# Patient Record
Sex: Female | Born: 1970 | Race: Black or African American | Hispanic: No | Marital: Single | Smoking: Never smoker
Health system: Southern US, Community
[De-identification: ages and names within clinical notes are randomized; demographics above are authoritative.]

## PROBLEM LIST (undated history)

## (undated) ENCOUNTER — Inpatient Hospital Stay (HOSPITAL_COMMUNITY): Payer: Self-pay

## (undated) DIAGNOSIS — N83209 Unspecified ovarian cyst, unspecified side: Secondary | ICD-10-CM

## (undated) HISTORY — PX: OVARIAN CYST REMOVAL: SHX89

## (undated) HISTORY — PX: DILATION AND CURETTAGE OF UTERUS: SHX78

## (undated) HISTORY — DX: Unspecified ovarian cyst, unspecified side: N83.209

---

## 1998-08-04 ENCOUNTER — Other Ambulatory Visit: Admission: RE | Admit: 1998-08-04 | Discharge: 1998-08-04 | Payer: Self-pay | Admitting: *Deleted

## 2000-03-22 ENCOUNTER — Inpatient Hospital Stay (HOSPITAL_COMMUNITY): Admission: AD | Admit: 2000-03-22 | Discharge: 2000-03-22 | Payer: Self-pay | Admitting: *Deleted

## 2000-03-22 ENCOUNTER — Emergency Department (HOSPITAL_COMMUNITY): Admission: EM | Admit: 2000-03-22 | Discharge: 2000-03-22 | Payer: Self-pay | Admitting: *Deleted

## 2000-03-25 ENCOUNTER — Encounter (INDEPENDENT_AMBULATORY_CARE_PROVIDER_SITE_OTHER): Payer: Self-pay

## 2000-03-25 ENCOUNTER — Ambulatory Visit (HOSPITAL_COMMUNITY): Admission: RE | Admit: 2000-03-25 | Discharge: 2000-03-25 | Payer: Self-pay | Admitting: *Deleted

## 2000-04-11 ENCOUNTER — Other Ambulatory Visit: Admission: RE | Admit: 2000-04-11 | Discharge: 2000-04-11 | Payer: Self-pay | Admitting: *Deleted

## 2001-05-24 ENCOUNTER — Other Ambulatory Visit: Admission: RE | Admit: 2001-05-24 | Discharge: 2001-05-24 | Payer: Self-pay | Admitting: *Deleted

## 2002-07-04 ENCOUNTER — Other Ambulatory Visit: Admission: RE | Admit: 2002-07-04 | Discharge: 2002-07-04 | Payer: Self-pay | Admitting: Obstetrics and Gynecology

## 2003-12-10 ENCOUNTER — Other Ambulatory Visit: Admission: RE | Admit: 2003-12-10 | Discharge: 2003-12-10 | Payer: Self-pay | Admitting: Obstetrics and Gynecology

## 2010-07-22 ENCOUNTER — Emergency Department (HOSPITAL_COMMUNITY): Admission: EM | Admit: 2010-07-22 | Discharge: 2010-07-22 | Payer: Self-pay | Admitting: Emergency Medicine

## 2010-07-23 ENCOUNTER — Encounter (INDEPENDENT_AMBULATORY_CARE_PROVIDER_SITE_OTHER): Payer: Self-pay | Admitting: *Deleted

## 2011-01-07 NOTE — Letter (Signed)
Summary: New Patient letter  [facility]  [address] [place], [place] [address]   Phone: [phone]  Fax: [phone]       07/23/2010 MRN: [id number]  [name] [name] [address] [place], [place]  [address]  Dear Ms. [name] [name],  Welcome to the Gastroenterology Division at [facility].    You are scheduled to see Dr. Stan Head on August 17, 2010 at 10:00am on the 3rd floor at [facility], [address]  We ask that you try to arrive at our office 15 minutes prior to your appointment time to allow for check-in.  We would like you to complete the enclosed self-administered evaluation form prior to your visit and bring it with you on the day of your appointment.  We will review it with you.  Also, please bring a complete list of all your medications or, if you prefer, bring the medication bottles and we will list them.  Please bring your insurance card so that we may make a copy of it.  If your insurance requires a referral to see a specialist, please bring your referral form from your primary care physician.  Co-payments are due at the time of your visit and may be paid by cash, check or credit card.     Your office visit will consist of a consult with your physician (includes a physical exam), any laboratory testing he/she may order, scheduling of any necessary diagnostic testing (e.g. x-ray, ultrasound, CT-scan), and scheduling of a procedure (e.g. Endoscopy, Colonoscopy) if required.  Please allow enough time on your schedule to allow for any/all of these possibilities.    If you cannot keep your appointment, please call [phone] to cancel or reschedule prior to your appointment date.  This allows [place] the opportunity to schedule an appointment for another patient in need of care.  If you do not cancel or reschedule by 5 p.m. the business day prior to your appointment date, you will be charged a $50.00 late cancellation/no-show fee.    Thank you for  choosing [facility] for your medical needs.  We appreciate the opportunity to care for you.  Please visit [place] at our website  to learn more about our practice.                     Sincerely,                                                             The Gastroenterology Division

## 2011-02-19 LAB — BASIC METABOLIC PANEL
BUN: 9 mg/dL (ref 6–23)
CO2: 30 mEq/L (ref 19–32)
Calcium: 9.5 mg/dL (ref 8.4–10.5)
Chloride: 105 mEq/L (ref 96–112)
Creatinine, Ser: 0.87 mg/dL (ref 0.4–1.2)
GFR calc Af Amer: >60 mL/min (ref >60)
GFR calc non Af Amer: >60 mL/min (ref >60)
Glucose, Bld: 97 mg/dL (ref 70–99)
Potassium: 4 mEq/L (ref 3.5–5.1)
Sodium: 140 mEq/L (ref 135–145)

## 2011-02-19 LAB — DIFFERENTIAL
Basophils Absolute: 0 10*3/uL (ref 0.0–0.1)
Basophils Relative: 0 % (ref 0–1)
Eosinophils Absolute: 0.1 10*3/uL (ref 0.0–0.7)
Eosinophils Relative: 2 % (ref 0–5)
Lymphocytes Relative: 47 % — ABNORMAL HIGH (ref 12–46)
Lymphs Abs: 3.2 10*3/uL (ref 0.7–4.0)
Monocytes Absolute: 0.5 10*3/uL (ref 0.1–1.0)
Monocytes Relative: 7 % (ref 3–12)
Neutro Abs: 2.9 10*3/uL (ref 1.7–7.7)
Neutrophils Relative %: 44 % (ref 43–77)

## 2011-02-19 LAB — CBC
HCT: 33.1 % — ABNORMAL LOW (ref 36.0–46.0)
Hemoglobin: 11 g/dL — ABNORMAL LOW (ref 12.0–15.0)
MCH: 28.1 pg (ref 26.0–34.0)
MCHC: 33.2 g/dL (ref 30.0–36.0)
MCV: 84.7 fL (ref 78.0–100.0)
Platelets: 293 10*3/uL (ref 150–400)
RBC: 3.91 MIL/uL (ref 3.87–5.11)
RDW: 13.7 % (ref 11.5–15.5)
WBC: 6.7 10*3/uL (ref 4.0–10.5)

## 2011-04-23 NOTE — Op Note (Signed)
[facility]  Patient:    LACONDA, BASICH                       MRN: [id number] Proc. Date: 03/25/00 Adm. Date:  [id number] Attending:  Ardeen Fillers CC:         Sung Amabile. Roslyn Smiling, M.D.                           Operative Report  INDICATIONS:                  This 40 year old woman G3, P0-0-2-0 last menstrual period February 01, 2000 with several week history of bleeding and missed abortion by improperly rising quantitative HCGs and ultrasound findings.  She has been counseled regarding the benefits and risks of D&E and has opted for that management.  PREOPERATIVE DIAGNOSIS:       Missed abortion at seven weeks gestational age. h positive.  POSTOPERATIVE DIAGNOSIS:      Missed abortion at seven weeks gestational age. h positive.  PROCEDURE:                    D&E.  SURGEON:                      Sung Amabile. Roslyn Smiling, M.D.  ANESTHESIA:                   MAC.  ESTIMATED BLOOD LOSS:         80 cc.  TUBES AND DRAINS:             None.  COMPLICATIONS:                None.  FINDINGS:                     Preoperative uterine size 6+ weeks. Postoperative uterine size top normal size.  Tissue obtained on curettage.  Uterine cavity regular.  No adnexal masses palpable.  ASSESSMENT:                   Uterine curetted to pathology.  PROCEDURE:                    After the establishment of MAC anesthesia, the patient was placed in the dorsal lithotomy position.  The perineum and vagina were prepped with Betadine solution.  Patient was draped.  Examination under anesthesia was carried out.  Graves speculum was inserted in the vagina.  The cervix was reprepped with Betadine solution.  The anterior cervical lip was infiltrated with 1% Xylocaine.  Paracervical block was placed in the usual fashion using 20 cc of 1% Xylocaine.  Uterine sound was used to negotiate the direction of the endocervical canal.  Pratt dilator was reused to dilate the cervix to  a # 27 [place].  A # 8 suction curette was passed easily into the endometrial cavity.  Suction curettage was performed.  Gentle and sharp curettage was performed.  A final pass of the suction curette was made.  The uterus was felt to be empty and smooth at the endoscopy of the case.  Instruments were removed.  Hemostasis was noted. Bladder was evaluated with straight catheterization.  The patient was returned to the supine position and transported to the recovery room in satisfactory condition. DD:  03/25/00 TD:  03/27/00 Job: [id number]

## 2011-07-07 ENCOUNTER — Other Ambulatory Visit: Payer: Self-pay | Admitting: General Practice

## 2011-07-07 ENCOUNTER — Ambulatory Visit
Admission: RE | Admit: 2011-07-07 | Discharge: 2011-07-07 | Disposition: A | Discharge status: Home / Self Care | Payer: BC Managed Care – PPO | Source: Ambulatory Visit | Attending: General Practice | Admitting: General Practice

## 2011-07-07 DIAGNOSIS — I10 Essential (primary) hypertension: Secondary | ICD-10-CM

## 2013-03-06 DIAGNOSIS — N83209 Unspecified ovarian cyst, unspecified side: Secondary | ICD-10-CM

## 2013-03-06 HISTORY — DX: Unspecified ovarian cyst, unspecified side: N83.209

## 2014-07-01 ENCOUNTER — Other Ambulatory Visit: Payer: Self-pay | Admitting: Internal Medicine

## 2014-07-01 ENCOUNTER — Other Ambulatory Visit: Payer: No Typology Code available for payment source | Admitting: Internal Medicine

## 2014-07-01 DIAGNOSIS — Z0000 Encounter for general adult medical examination without abnormal findings: Secondary | ICD-10-CM

## 2014-07-01 LAB — CBC WITH DIFFERENTIAL/PLATELET
BASOS PCT: 0 % (ref 0–1)
Basophils Absolute: 0 10*3/uL (ref 0.0–0.1)
Eosinophils Absolute: 0.1 10*3/uL (ref 0.0–0.7)
Eosinophils Relative: 2 % (ref 0–5)
HCT: 33.3 % — ABNORMAL LOW (ref 36.0–46.0)
Hemoglobin: 11 g/dL — ABNORMAL LOW (ref 12.0–15.0)
Lymphocytes Relative: 42 % (ref 12–46)
Lymphs Abs: 2.5 10*3/uL (ref 0.7–4.0)
MCH: 26.3 pg (ref 26.0–34.0)
MCHC: 33 g/dL (ref 30.0–36.0)
MCV: 79.5 fL (ref 78.0–100.0)
MONOS PCT: 6 % (ref 3–12)
Monocytes Absolute: 0.4 10*3/uL (ref 0.1–1.0)
NEUTROS ABS: 3 10*3/uL (ref 1.7–7.7)
NEUTROS PCT: 50 % (ref 43–77)
Platelets: 379 10*3/uL (ref 150–400)
RBC: 4.19 MIL/uL (ref 3.87–5.11)
RDW: 15.6 % — AB (ref 11.5–15.5)
WBC: 5.9 10*3/uL (ref 4.0–10.5)

## 2014-07-01 LAB — COMPREHENSIVE METABOLIC PANEL
ALT: 15 U/L (ref 0–35)
AST: 14 U/L (ref 0–37)
Albumin: 3.8 g/dL (ref 3.5–5.2)
Alkaline Phosphatase: 91 U/L (ref 39–117)
BILIRUBIN TOTAL: 0.4 mg/dL (ref 0.2–1.2)
BUN: 15 mg/dL (ref 6–23)
CO2: 23 mEq/L (ref 19–32)
Calcium: 9.2 mg/dL (ref 8.4–10.5)
Chloride: 105 mEq/L (ref 96–112)
Creat: 0.77 mg/dL (ref 0.50–1.10)
Glucose, Bld: 79 mg/dL (ref 70–99)
Potassium: 4.3 mEq/L (ref 3.5–5.3)
SODIUM: 136 meq/L (ref 135–145)
TOTAL PROTEIN: 7.1 g/dL (ref 6.0–8.3)

## 2014-07-01 LAB — LIPID PANEL
Cholesterol: 220 mg/dL — ABNORMAL HIGH (ref 0–200)
HDL: 51 mg/dL (ref >39)
LDL Cholesterol: 148 mg/dL — ABNORMAL HIGH (ref 0–99)
TRIGLYCERIDES: 105 mg/dL (ref <150)
Total CHOL/HDL Ratio: 4.3 Ratio
VLDL: 21 mg/dL (ref 0–40)

## 2014-07-01 LAB — TSH: TSH: 2.687 u[IU]/mL (ref 0.350–4.500)

## 2014-07-02 ENCOUNTER — Encounter: Payer: Self-pay | Admitting: Internal Medicine

## 2014-07-02 ENCOUNTER — Ambulatory Visit (INDEPENDENT_AMBULATORY_CARE_PROVIDER_SITE_OTHER): Payer: No Typology Code available for payment source | Admitting: Internal Medicine

## 2014-07-02 VITALS — BP 128/90 | HR 84 | Temp 98.0°F | Ht 66.5 in | Wt 307.0 lb

## 2014-07-02 DIAGNOSIS — K5904 Chronic idiopathic constipation: Secondary | ICD-10-CM

## 2014-07-02 DIAGNOSIS — Z0000 Encounter for general adult medical examination without abnormal findings: Secondary | ICD-10-CM

## 2014-07-02 DIAGNOSIS — D509 Iron deficiency anemia, unspecified: Secondary | ICD-10-CM

## 2014-07-02 DIAGNOSIS — K5909 Other constipation: Secondary | ICD-10-CM

## 2014-07-02 LAB — IRON AND TIBC
%SAT: 10 % — ABNORMAL LOW (ref 20–55)
Iron: 39 ug/dL — ABNORMAL LOW (ref 42–145)
TIBC: 385 ug/dL (ref 250–470)
UIBC: 346 ug/dL (ref 125–400)

## 2014-07-02 LAB — VITAMIN D 25 HYDROXY (VIT D DEFICIENCY, FRACTURES): Vit D, 25-Hydroxy: 41 ng/mL (ref 30–89)

## 2014-07-07 ENCOUNTER — Encounter: Payer: Self-pay | Admitting: Internal Medicine

## 2014-07-07 DIAGNOSIS — K5904 Chronic idiopathic constipation: Secondary | ICD-10-CM | POA: Insufficient documentation

## 2014-07-07 NOTE — Progress Notes (Signed)
   Subjective:    Patient ID: [name] [name], female    DOB: 19-Dec-1970, 43 y.o.   MRN: [id number]  HPI First visit for this 43 year old Black  female adopted daughter of Chrissie NoaWilliam and Rayne DuShirley Flicker.  No known drug allergies.  Patient had surgery for abdominal pain while living in [place] in 2014. Had acute episode of lower abdominal pain. Was told that she had a ruptured ovarian cyst. She's not sure exactly what was done during the operation. Apparently was hospitalized about a month. Says incision got infected. Wisdom teeth extracted 2003. D&C procedure 2005 after a miscarriage.  Total of 2 pregnancies, one abortion. One miscarriage. No children.  Social history: She is single. Has a 4 year college degree. Recently received second-degree in [place].  Does not smoke. Social alcohol consumption. Resides alone.  Only medications currently multivitamin daily. Does take melatonin on occasion for sleep.  History of warts on her fingers in the past an anal fissure.  Exercise consists of walking her dog 2-3 times daily  Currently unemployed. Was living in [place] in 2014 learning [place]. Thinking about what she would like to do next in life.    Review of Systems  Respiratory:       Shortness of breath while walking but is overweight and perhaps deconditioned  Gastrointestinal:       Long-standing history of constipation  Skin:       Complains of hair loss  Psychiatric/Behavioral:       History of insomnia       Objective:   Physical Exam  Vitals reviewed. Constitutional: She is oriented to person, place, and time. She appears well-developed and well-nourished. No distress.  HENT:  Head: Normocephalic and atraumatic.  Right Ear: External ear normal.  Left Ear: External ear normal.  Mouth/Throat: Oropharynx is clear and moist. No oropharyngeal exudate.  Eyes: Conjunctivae and EOM are normal. Pupils are equal, round, and reactive to light. Right eye exhibits no discharge. Left eye  exhibits no discharge. No scleral icterus.  Neck: Neck supple. No JVD present. No thyromegaly present.  Cardiovascular: Normal rate, normal heart sounds and intact distal pulses.   No murmur heard. Pulmonary/Chest: Effort normal and breath sounds normal. No respiratory distress. She has no wheezes.  Breasts normal female  Abdominal: Soft. Bowel sounds are normal. She exhibits no distension and no mass. There is no tenderness. There is no rebound and no guarding.  Musculoskeletal: Normal range of motion. She exhibits no edema.  Lymphadenopathy:    She has no cervical adenopathy.  Neurological: She is alert and oriented to person, place, and time. She has normal reflexes. No cranial nerve deficit. Coordination normal.  Skin: Skin is warm and dry. No rash noted. She is not diaphoretic.  Psychiatric: She has a normal mood and affect. Her behavior is normal. Judgment and thought content normal.          Assessment & Plan:  Obesity- discussed diet and exercise  Decreased MCV-check for iron deficiency  Functional constipation-recommend MiraLAX daily  Insomnia-treated with melatonin  Possible mild depression-doesn't seem that she knows quite what she wants to do with her career at this point  Plan: Deferred to GYN exam because patient is on menses today. Followup with any concerns at next visit.

## 2014-07-07 NOTE — Patient Instructions (Signed)
Return in 3 weeks for Pap smear and pelvic exam and followup on constipation and iron deficiency

## 2014-07-11 NOTE — Progress Notes (Signed)
Patient informed. Will schedule this when she comes in for her Pap.

## 2014-08-09 ENCOUNTER — Ambulatory Visit: Payer: No Typology Code available for payment source | Admitting: Internal Medicine

## 2014-09-09 ENCOUNTER — Telehealth: Payer: Self-pay | Admitting: Internal Medicine

## 2014-09-09 ENCOUNTER — Ambulatory Visit: Payer: No Typology Code available for payment source | Admitting: Internal Medicine

## 2014-09-09 NOTE — Telephone Encounter (Signed)
Appointment for Pap smear rescheduled for Monday, October 19 at 4 PM. Patient advised that if she misses appointment without calling to cancel she will be charged a no-show Fee. Patient says she forgot about this appointment for today.

## 2014-09-09 NOTE — Telephone Encounter (Signed)
Message left on voice mail regarding this appointment for Pap smear today. At last visit, Pap smear was deferred because she was on menses. Patient was asked to call the office to reschedule this appointment.

## 2014-09-23 ENCOUNTER — Other Ambulatory Visit (HOSPITAL_COMMUNITY)
Admission: RE | Admit: 2014-09-23 | Discharge: 2014-09-23 | Disposition: A | Discharge status: Home / Self Care | Payer: BC Managed Care – PPO | Source: Ambulatory Visit | Attending: Internal Medicine | Admitting: Internal Medicine

## 2014-09-23 ENCOUNTER — Ambulatory Visit (INDEPENDENT_AMBULATORY_CARE_PROVIDER_SITE_OTHER): Payer: BC Managed Care – PPO | Admitting: Internal Medicine

## 2014-09-23 ENCOUNTER — Encounter: Payer: Self-pay | Admitting: Internal Medicine

## 2014-09-23 VITALS — BP 140/82 | HR 113 | Temp 99.2°F | Ht 66.5 in | Wt 307.0 lb

## 2014-09-23 DIAGNOSIS — Z01419 Encounter for gynecological examination (general) (routine) without abnormal findings: Secondary | ICD-10-CM | POA: Insufficient documentation

## 2014-09-23 DIAGNOSIS — Z0000 Encounter for general adult medical examination without abnormal findings: Secondary | ICD-10-CM

## 2014-09-23 DIAGNOSIS — Z23 Encounter for immunization: Secondary | ICD-10-CM

## 2014-09-23 NOTE — Progress Notes (Signed)
   Subjective:    Patient ID: [name] [name], female    DOB: [birth year], 43 y.o.   MRN: [id number]  HPI  At initial visit we could not do Pap smear because she was on her menstrual period. Says she is sexually active and the last time was about 2 months ago. Says she used protection. Says that recently she had a menstrual period early October that lasted just a few days and then one day of spotting around October 15 which is unusual for her. She is to let he know that these issues persist.    Review of Systems     Objective:   Physical Exam Normal female external genitalia Pap smear taken. No masses on bimanual exam      Assessment & Plan:  Pelvic exam completed because it was postponed at initial visit due to menses   Plan: Return as needed.

## 2014-09-23 NOTE — Patient Instructions (Addendum)
Pap smear completed. Flu vaccine given at last visit.

## 2014-09-25 LAB — CYTOLOGY - PAP

## 2014-12-03 ENCOUNTER — Other Ambulatory Visit (HOSPITAL_COMMUNITY): Payer: Self-pay | Admitting: Obstetrics & Gynecology

## 2014-12-03 DIAGNOSIS — Z3A19 19 weeks gestation of pregnancy: Secondary | ICD-10-CM

## 2014-12-10 ENCOUNTER — Ambulatory Visit (HOSPITAL_COMMUNITY): Payer: BLUE CROSS/BLUE SHIELD

## 2014-12-16 ENCOUNTER — Encounter (HOSPITAL_COMMUNITY)
Admission: AD | Disposition: A | Discharge status: Home / Self Care | Payer: Self-pay | Source: Ambulatory Visit | Attending: Obstetrics and Gynecology

## 2014-12-16 ENCOUNTER — Inpatient Hospital Stay (HOSPITAL_COMMUNITY): Payer: BLUE CROSS/BLUE SHIELD | Admitting: Anesthesiology

## 2014-12-16 ENCOUNTER — Observation Stay (HOSPITAL_COMMUNITY)
Admission: AD | Admit: 2014-12-16 | Discharge: 2014-12-18 | Disposition: A | Discharge status: Home / Self Care | Payer: BLUE CROSS/BLUE SHIELD | Source: Ambulatory Visit | Attending: Obstetrics and Gynecology | Admitting: Obstetrics and Gynecology

## 2014-12-16 ENCOUNTER — Encounter (HOSPITAL_COMMUNITY): Payer: Self-pay | Admitting: *Deleted

## 2014-12-16 ENCOUNTER — Other Ambulatory Visit (HOSPITAL_COMMUNITY): Payer: Self-pay

## 2014-12-16 ENCOUNTER — Encounter (HOSPITAL_COMMUNITY): Payer: Self-pay

## 2014-12-16 ENCOUNTER — Other Ambulatory Visit (HOSPITAL_COMMUNITY): Payer: Self-pay | Admitting: Obstetrics & Gynecology

## 2014-12-16 ENCOUNTER — Ambulatory Visit (HOSPITAL_COMMUNITY)
Admission: RE | Admit: 2014-12-16 | Discharge: 2014-12-16 | Disposition: A | Discharge status: Home / Self Care | Payer: BLUE CROSS/BLUE SHIELD | Source: Ambulatory Visit | Attending: Obstetrics & Gynecology | Admitting: Obstetrics & Gynecology

## 2014-12-16 DIAGNOSIS — Z6841 Body Mass Index (BMI) 40.0 and over, adult: Secondary | ICD-10-CM | POA: Insufficient documentation

## 2014-12-16 DIAGNOSIS — O99212 Obesity complicating pregnancy, second trimester: Secondary | ICD-10-CM

## 2014-12-16 DIAGNOSIS — Z36 Encounter for antenatal screening of mother: Secondary | ICD-10-CM

## 2014-12-16 DIAGNOSIS — O3432 Maternal care for cervical incompetence, second trimester: Principal | ICD-10-CM | POA: Diagnosis present

## 2014-12-16 DIAGNOSIS — Z885 Allergy status to narcotic agent status: Secondary | ICD-10-CM | POA: Diagnosis not present

## 2014-12-16 DIAGNOSIS — O09529 Supervision of elderly multigravida, unspecified trimester: Secondary | ICD-10-CM | POA: Insufficient documentation

## 2014-12-16 DIAGNOSIS — O09522 Supervision of elderly multigravida, second trimester: Secondary | ICD-10-CM | POA: Insufficient documentation

## 2014-12-16 DIAGNOSIS — O26872 Cervical shortening, second trimester: Secondary | ICD-10-CM | POA: Insufficient documentation

## 2014-12-16 DIAGNOSIS — Z3A20 20 weeks gestation of pregnancy: Secondary | ICD-10-CM | POA: Insufficient documentation

## 2014-12-16 DIAGNOSIS — Z3A19 19 weeks gestation of pregnancy: Secondary | ICD-10-CM | POA: Insufficient documentation

## 2014-12-16 HISTORY — PX: CERVICAL CERCLAGE: SHX1329

## 2014-12-16 LAB — CBC WITH DIFFERENTIAL/PLATELET
BASOS ABS: 0 10*3/uL (ref 0.0–0.1)
Basophils Relative: 0 % (ref 0–1)
Eosinophils Absolute: 0.1 10*3/uL (ref 0.0–0.7)
Eosinophils Relative: 1 % (ref 0–5)
HEMATOCRIT: 31.7 % — AB (ref 36.0–46.0)
Hemoglobin: 10.8 g/dL — ABNORMAL LOW (ref 12.0–15.0)
LYMPHS ABS: 2.6 10*3/uL (ref 0.7–4.0)
LYMPHS PCT: 37 % (ref 12–46)
MCH: 28.9 pg (ref 26.0–34.0)
MCHC: 34.1 g/dL (ref 30.0–36.0)
MCV: 84.8 fL (ref 78.0–100.0)
Monocytes Absolute: 0.4 10*3/uL (ref 0.1–1.0)
Monocytes Relative: 5 % (ref 3–12)
NEUTROS PCT: 57 % (ref 43–77)
Neutro Abs: 4.1 10*3/uL (ref 1.7–7.7)
PLATELETS: 244 10*3/uL (ref 150–400)
RBC: 3.74 MIL/uL — ABNORMAL LOW (ref 3.87–5.11)
RDW: 14.4 % (ref 11.5–15.5)
WBC: 7.2 10*3/uL (ref 4.0–10.5)

## 2014-12-16 LAB — AMNISURE RUPTURE OF MEMBRANE (ROM) NOT AT ARMC: AMNISURE: NEGATIVE

## 2014-12-16 SURGERY — CERCLAGE, CERVIX, VAGINAL APPROACH
Anesthesia: Spinal | Site: Vagina

## 2014-12-16 MED ORDER — MEPERIDINE HCL 25 MG/ML IJ SOLN: 6.2500 mg | INTRAMUSCULAR | Status: DC | PRN

## 2014-12-16 MED ORDER — DOCUSATE SODIUM 100 MG PO CAPS
100.0000 mg | ORAL_CAPSULE | Freq: Two times a day (BID) | ORAL | Status: DC
  Administered 2014-12-16 – 2014-12-18 (×4): 100 mg via ORAL
  Filled 2014-12-16 (×4): qty 1

## 2014-12-16 MED ORDER — FENTANYL CITRATE 0.05 MG/ML IJ SOLN: 25.0000 ug | INTRAMUSCULAR | Status: DC | PRN

## 2014-12-16 MED ORDER — BUPIVACAINE IN DEXTROSE 0.75-8.25 % IT SOLN
INTRATHECAL | Status: DC | PRN
Start: 2014-12-16 — End: 2014-12-16
  Administered 2014-12-16: 9 mg via INTRATHECAL

## 2014-12-16 MED ORDER — ONDANSETRON HCL 4 MG/2ML IJ SOLN: 4.0000 mg | Freq: Four times a day (QID) | INTRAMUSCULAR | Status: DC | PRN

## 2014-12-16 MED ORDER — LACTATED RINGERS IV SOLN
INTRAVENOUS | Status: DC
  Administered 2014-12-16: 18:00:00 via INTRAVENOUS

## 2014-12-16 MED ORDER — LACTATED RINGERS IV SOLN
INTRAVENOUS | Status: DC
  Administered 2014-12-16 – 2014-12-17 (×2): via INTRAVENOUS

## 2014-12-16 MED ORDER — GLYCOPYRROLATE 0.2 MG/ML IJ SOLN
INTRAMUSCULAR | Status: AC
  Filled 2014-12-16: qty 1

## 2014-12-16 MED ORDER — MIDAZOLAM HCL 2 MG/2ML IJ SOLN: 0.5000 mg | Freq: Once | INTRAMUSCULAR | Status: DC | PRN

## 2014-12-16 MED ORDER — EPHEDRINE 5 MG/ML INJ
INTRAVENOUS | Status: AC
  Filled 2014-12-16: qty 10

## 2014-12-16 MED ORDER — EPHEDRINE SULFATE 50 MG/ML IJ SOLN
INTRAMUSCULAR | Status: DC | PRN
  Administered 2014-12-16: 5 mg via INTRAVENOUS
  Administered 2014-12-16: 10 mg via INTRAVENOUS
  Administered 2014-12-16: 5 mg via INTRAVENOUS

## 2014-12-16 MED ORDER — INDOMETHACIN 50 MG RE SUPP
50.0000 mg | Freq: Once | RECTAL | Status: AC
  Administered 2014-12-16: 50 mg via RECTAL
  Filled 2014-12-16: qty 1

## 2014-12-16 MED ORDER — ACETAMINOPHEN 325 MG PO TABS: 325.0000 mg | ORAL_TABLET | ORAL | Status: DC | PRN

## 2014-12-16 MED ORDER — OXYCODONE-ACETAMINOPHEN 5-325 MG PO TABS
1.0000 | ORAL_TABLET | ORAL | Status: DC | PRN
  Administered 2014-12-16: 1 via ORAL
  Filled 2014-12-16: qty 1

## 2014-12-16 MED ORDER — GLYCOPYRROLATE 0.2 MG/ML IJ SOLN
INTRAMUSCULAR | Status: DC | PRN
  Administered 2014-12-16: 0.2 mg via INTRAVENOUS

## 2014-12-16 MED ORDER — PROMETHAZINE HCL 25 MG/ML IJ SOLN: 6.2500 mg | INTRAMUSCULAR | Status: DC | PRN

## 2014-12-16 MED ORDER — ALUM & MAG HYDROXIDE-SIMETH 200-200-20 MG/5ML PO SUSP: 30.0000 mL | ORAL | Status: DC | PRN

## 2014-12-16 MED ORDER — INDOMETHACIN 25 MG SUPPOSITORY: 25.0000 mg | Freq: Three times a day (TID) | RECTAL | Status: DC

## 2014-12-16 MED ORDER — DEXTROSE 5 % IV SOLN
3.0000 g | INTRAVENOUS | Status: AC
  Administered 2014-12-16: 3 g via INTRAVENOUS
  Filled 2014-12-16: qty 3000

## 2014-12-16 MED ORDER — ACETAMINOPHEN 160 MG/5ML PO SOLN: 325.0000 mg | ORAL | Status: DC | PRN

## 2014-12-16 MED ORDER — FAMOTIDINE IN NACL 20-0.9 MG/50ML-% IV SOLN
20.0000 mg | Freq: Once | INTRAVENOUS | Status: AC
  Administered 2014-12-16: 20 mg via INTRAVENOUS
  Filled 2014-12-16: qty 50

## 2014-12-16 MED ORDER — ZOLPIDEM TARTRATE 5 MG PO TABS
5.0000 mg | ORAL_TABLET | Freq: Every evening | ORAL | Status: DC | PRN
  Administered 2014-12-16: 5 mg via ORAL
  Filled 2014-12-16: qty 1

## 2014-12-16 MED ORDER — CITRIC ACID-SODIUM CITRATE 334-500 MG/5ML PO SOLN
30.0000 mL | Freq: Once | ORAL | Status: AC
  Administered 2014-12-16: 30 mL via ORAL
  Filled 2014-12-16: qty 15

## 2014-12-16 MED ORDER — INDOMETHACIN 25 MG PO CAPS
25.0000 mg | ORAL_CAPSULE | Freq: Four times a day (QID) | ORAL | Status: DC
  Administered 2014-12-17 – 2014-12-18 (×5): 25 mg via ORAL
  Filled 2014-12-16 (×6): qty 1

## 2014-12-16 MED ORDER — LACTATED RINGERS IV SOLN
INTRAVENOUS | Status: DC
  Administered 2014-12-16 (×2): via INTRAVENOUS

## 2014-12-16 MED ORDER — ONDANSETRON HCL 4 MG PO TABS: 4.0000 mg | ORAL_TABLET | Freq: Four times a day (QID) | ORAL | Status: DC | PRN

## 2014-12-16 SURGICAL SUPPLY — 17 items
CLOTH BEACON ORANGE TIMEOUT ST (SAFETY) ×3 IMPLANT
COUNTER NEEDLE 1200 MAGNETIC (NEEDLE) IMPLANT
GLOVE BIO SURGEON STRL SZ7.5 (GLOVE) ×6 IMPLANT
GLOVE BIOGEL PI IND STRL 8 (GLOVE) ×1 IMPLANT
GLOVE BIOGEL PI INDICATOR 8 (GLOVE) ×2
GOWN STRL REUS W/TWL LRG LVL3 (GOWN DISPOSABLE) ×6 IMPLANT
NS IRRIG 1000ML POUR BTL (IV SOLUTION) ×3 IMPLANT
PACK VAGINAL MINOR WOMEN LF (CUSTOM PROCEDURE TRAY) ×3 IMPLANT
PAD OB MATERNITY 4.3X12.25 (PERSONAL CARE ITEMS) ×3 IMPLANT
PAD PREP 24X48 CUFFED NSTRL (MISCELLANEOUS) ×3 IMPLANT
SUT NOVA 1 T20/GS 25DT (SUTURE) ×3 IMPLANT
TOWEL OR 17X24 6PK STRL BLUE (TOWEL DISPOSABLE) ×6 IMPLANT
TRAY FOLEY CATH 14FR (SET/KITS/TRAYS/PACK) ×3 IMPLANT
TUBING NON-CON 1/4 X 20 CONN (TUBING) IMPLANT
TUBING NON-CON 1/4 X 20' CONN (TUBING)
WATER STERILE IRR 1000ML POUR (IV SOLUTION) ×3 IMPLANT
YANKAUER SUCT BULB TIP NO VENT (SUCTIONS) IMPLANT

## 2014-12-16 NOTE — Anesthesia Postprocedure Evaluation (Signed)
Anesthesia Post Note  Patient: [name] [name]  Procedure(s) Performed: Procedure(s) (LRB): CERCLAGE CERVICAL (N/A)  Anesthesia type: Spinal  Patient location: PACU  Post pain: Pain level controlled  Post assessment: Post-op Vital signs reviewed  Last Vitals:  Filed Vitals:   12/16/14 2115  BP: 100/37  Pulse: 69  Temp: 37.2 C  Resp: 22    Post vital signs: Reviewed  Level of consciousness: awake  Complications: No apparent anesthesia complications

## 2014-12-16 NOTE — Transfer of Care (Signed)
Immediate Anesthesia Transfer of Care Note  Patient: [name] [name]  Procedure(s) Performed: Procedure(s): CERCLAGE CERVICAL (N/A)  Patient Location: PACU  Anesthesia Type:Spinal  Level of Consciousness: awake  Airway & Oxygen Therapy: Patient Spontanous Breathing  Post-op Assessment: Report given to PACU RN  Post vital signs: Reviewed and stable  Complications: No apparent anesthesia complications

## 2014-12-16 NOTE — MAU Note (Signed)
Pt presents to MAU from MFM for monitoring. U/S this am showed a short cervix and she was sent to monitor to evaluate for cerclage placement.

## 2014-12-16 NOTE — Anesthesia Preprocedure Evaluation (Signed)
Anesthesia Evaluation  Patient identified by MRN, date of birth, ID band Patient awake    Reviewed: Allergy & Precautions, H&P , Patient's Chart, lab work & pertinent test results  Airway Mallampati: III      Dental   Pulmonary  breath sounds clear to auscultation        Cardiovascular Exercise Tolerance: Good Rhythm:regular Rate:Normal     Neuro/Psych negative psych ROS   GI/Hepatic   Endo/Other  Morbid obesity  Renal/GU      Musculoskeletal   Abdominal   Peds  Hematology   Anesthesia Other Findings   Reproductive/Obstetrics                          Anesthesia Physical Anesthesia Plan  ASA: III  Anesthesia Plan: Spinal   Post-op Pain Management:    Induction:   Airway Management Planned:   Additional Equipment:   Intra-op Plan:   Post-operative Plan:   Informed Consent: I have reviewed the patients History and Physical, chart, labs and discussed the procedure including the risks, benefits and alternatives for the proposed anesthesia with the patient or authorized representative who has indicated his/her understanding and acceptance.     Plan Discussed with: Anesthesiologist, CRNA and Surgeon  Anesthesia Plan Comments:         Anesthesia Quick Evaluation  

## 2014-12-16 NOTE — Brief Op Note (Signed)
12/16/2014  8:03 PM  PATIENT:  [name] [name]  44 y.o. female  PRE-OPERATIVE DIAGNOSIS:  incompetent cervix  POST-OPERATIVE DIAGNOSIS:  incompetent cervix  PROCEDURE:  Procedure(s): CERCLAGE CERVICAL (N/A)  SURGEON:  Surgeon(s) and Role:    * Leslie AndreaJames E Brevin Mcfadden II, MD - Primary  PHYSICIAN ASSISTANT:   ASSISTANTS: none   ANESTHESIA:   spinal  EBL:  Total I/O In: 1000 [I.V.:1000] Out: 40 [Urine:30; Blood:10]  BLOOD ADMINISTERED:none  DRAINS: Urinary Catheter (Foley)   LOCAL MEDICATIONS USED:  NONE  SPECIMEN:  No Specimen  DISPOSITION OF SPECIMEN:  N/A  COUNTS:  YES  TOURNIQUET:  * No tourniquets in log *  DICTATION: .Other Dictation: Dictation Number [id number]  PLAN OF CARE: Admit for overnight observation  PATIENT DISPOSITION:  PACU - hemodynamically stable.   Delay start of Pharmacological VTE agent (>24hrs) due to surgical blood loss or risk of bleeding: not applicable

## 2014-12-16 NOTE — Anesthesia Procedure Notes (Signed)
Spinal Patient location during procedure: OR Start time: 12/16/2014 7:36 PM End time: 12/16/2014 7:38 PM Staffing Anesthesiologist: Leilani AbleHATCHETT, Azana Kiesler Performed by: anesthesiologist  Preanesthetic Checklist Completed: patient identified, surgical consent, pre-op evaluation, timeout performed, IV checked, risks and benefits discussed and monitors and equipment checked Spinal Block Patient position: sitting Prep: site prepped and draped and DuraPrep Patient monitoring: heart rate, cardiac monitor, continuous pulse ox and blood pressure Approach: midline Location: L3-4 Injection technique: single-shot Needle Needle type: Sprotte  Needle gauge: 24 G Needle length: 9 cm Needle insertion depth: 9 cm Assessment Sensory level: T10

## 2014-12-16 NOTE — H&P (Signed)
[name] [name] is an 44 y.o. female had U/S today with Dr Claudean Severance in [facility] for anatomic survey with AMA. He noted no measurable cervix. He recommended cervical cerclage. Patient has had occasional discharge-clear. No bleeding, no fever. She reports two previous first trimester SAB with D&C.  Pertinent Gynecological History: Menses: pregnant Bleeding: N/A Contraception: none DES exposure: denies Blood transfusions: none Sexually transmitted diseases: no past history Previous GYN Procedures: N/A  Last mammogram: normal Date: 2015 Last pap: normal Date: 2015 OB History: G3, P0   Menstrual History: Menarche age: unknown  Patient's last menstrual period was 09/16/2014.    Past Medical History  Diagnosis Date  . Ovarian cyst 03/2013    in [place]    Past Surgical History  Procedure Laterality Date  . Dilation and curettage of uterus    . Ovarian cyst removal      Family History  Problem Relation Age of Onset  . Adopted: Yes    Social History:  reports that she has never smoked. She has never used smokeless tobacco. She reports that she drinks about 0.5 oz of alcohol per week. She reports that she does not use illicit drugs.  Allergies:  Allergies  Allergen Reactions  . Hydrocodone Nausea And Vomiting and Palpitations    Causes nightmares    Prescriptions prior to admission  Medication Sig Dispense Refill Last Dose  . ferrous sulfate 325 (65 FE) MG tablet Take 325 mg by mouth daily with breakfast.   12/15/2014 at Unknown time  . Prenatal Vit-Fe Fumarate-FA (PRENATAL MULTIVITAMIN) TABS tablet Take 1 tablet by mouth daily at 12 noon.   12/15/2014 at 2200  . Multiple Vitamins-Minerals (MULTIVITAMIN WITH MINERALS) tablet Take 1 tablet by mouth daily.   Not Taking at Unknown time    Review of Systems  Eyes: Negative for blurred vision.  Gastrointestinal: Negative for abdominal pain.  Neurological: Negative for headaches.    Blood pressure 134/80, pulse 78, temperature 98.2 F  (36.8 C), resp. rate 18, last menstrual period 09/16/2014. Physical Exam  Cardiovascular: Normal rate and regular rhythm.   Respiratory: Effort normal and breath sounds normal.  GI: Soft. There is no tenderness.  Neurological: She has normal reflexes.   Spec exam-no pool, cervix appears closed Digital exam-cx closed  Results for orders placed or performed during the hospital encounter of 12/16/14 (from the past 24 hour(s))  Amnisure rupture of membrane (rom)     Status: None   Collection Time: 12/16/14  3:00 PM  Result Value Ref Range   Amnisure ROM NEGATIVE     US Ob Detail + 14 Wk  12/16/2014   OBSTETRICAL ULTRASOUND: This exam was performed within a [facility]. The OB US report was generated in the AS system, and faxed to the ordering physician.   This report is available in the [employer]. See the AS Obstetric US report via the Image Link.  [place] [facility] Ob Transvaginal  12/16/2014   OBSTETRICAL ULTRASOUND: This exam was performed within a [facility]. The OB US report was generated in the AS system, and faxed to the ordering physician.   This report is available in the [employer]. See the AS Obstetric US report via the Image Link.   Assessment/Plan: 44 yo G3P0 at 15 6/7 weeks with incompetent cervix by U/S criteria-Dr Whitecar recommends cervical cerclage D/W patient and her mother above. D/W threat of unpredictable previable or preterm delivery. D/W options of expectant management possibly with vaginal progesterone or  cerclage. D/W concept of rescue cerclage vs prophylactic cerclage with marked increased risk of PROM and previable pregnancy loss and infection, bleeding/transfusion with HIV/Hep risk with rescue procedure. Reviewed modified activity for pregnancy. Also emphasized that cerclage may fail to prevent previable or preterm delivery-it is not a guarantee. Patient and mother state they understand and agree to cervical cerclage. All  questions answered.  Keyton Bhat II,[place] Freyre E 12/16/2014, 5:28 PM

## 2014-12-17 ENCOUNTER — Encounter (HOSPITAL_COMMUNITY): Payer: Self-pay | Admitting: Obstetrics and Gynecology

## 2014-12-17 NOTE — Op Note (Signed)
NAME:  Severa, Blake                ACCOUNT NO.:  [id number]  MEDICAL RECORD NO.:  [id number]  LOCATION:                                 FACILITY:  PHYSICIAN:  Guy SandiferJames E. Henderson Cloudomblin, M.D. DATE OF BIRTH:  28-Jun-1971  DATE OF PROCEDURE:  12/16/2014 DATE OF DISCHARGE:                              OPERATIVE REPORT   PREOPERATIVE DIAGNOSIS:  Incompetent cervix.  POSTOPERATIVE DIAGNOSIS:  Incompetent cervix at 19 and 6/7 weeks' gestation.  PROCEDURE:  McDonald cervical cerclage.  SURGEON:  Guy SandiferJames E. Henderson Cloudomblin, M.D.  ANESTHESIA:  Spinal.  ANESTHESIOLOGIST:  Quillian QuinceJohn F. Hatchett, M.D.  ESTIMATED BLOOD LOSS:  Less than 100 mL.  INDICATIONS AND CONSENT:  This patient is a 44 year old, African American female, G3, P0, with cervical incompetence based on ultrasound examination.  Details are dictated in the history and physical.  After discussing the options with the patient and her mother, she elects cervical cerclage.  Potential risks and complications were discussed preoperatively including, but not limited to, infection, organ damage, bleeding requiring transfusion of blood products with HIV and hepatitis acquisition, infection, ruptured membranes, and loss of the pregnancy. The patient states she understands and agrees.  All questions were answered and consent is signed on the chart.  DESCRIPTION OF PROCEDURE:  The patient was taken to the operating room, where she was identified and spinal anesthetic was placed per Dr. Arby BarretteHatchett.  She was placed in dorsal lithotomy position.  Time-out was undertaken.  The patient is gently prepped on the vulva and vagina with Betadine and draped in a sterile fashion.  Bladder was also straight catheterized.  Examination reveals the cervix to be closed.  Inspection revealed the cervix to appear closed as well.  A #1 Novafil suture was then used to place a single McDonald cerclage beginning and ending at the 12 o'clock position in a counterclockwise direction.   This proceeded well and good hemostasis was noted when the stitch was tied down.  No leaking of amniotic fluid is noted.  The patient tolerates the procedure well.  She was taken to recovery room in stable condition.    Guy SandiferJames E. Henderson Cloudomblin, M.D.    JET/MEDQ  D:  12/16/2014  T:  12/17/2014  Job:  [id number]

## 2014-12-17 NOTE — Progress Notes (Signed)
Spoke to patient Patient is doing well. No complaints Abdomen is soft and non tender IMPRESSION: POD #1 from Cerclage IUP at 20 weeks PLAN: Patient doing well Continue Ancef and Indomethacin Discharge home tomorrow if stable.

## 2014-12-17 NOTE — Anesthesia Postprocedure Evaluation (Signed)
  Anesthesia Post-op Note  Patient: [name] [name]  Procedure(s) Performed: Procedure(s): CERCLAGE CERVICAL (N/A)  Patient Location: Women's Unit  Anesthesia Type:Spinal  Level of Consciousness: awake, alert , oriented and patient cooperative  Airway and Oxygen Therapy: Patient Spontanous Breathing  Post-op Pain: mild  Post-op Assessment: Patient's Cardiovascular Status Stable, Respiratory Function Stable, No headache, No backache, No residual numbness and No residual motor weakness  Post-op Vital Signs: stable  Last Vitals:  Filed Vitals:   12/17/14 0516  BP: 104/47  Pulse: 97  Temp: 36.9 C  Resp: 18    Complications: No apparent anesthesia complications

## 2014-12-17 NOTE — Progress Notes (Signed)
S: reports some mild cramping this am  O: VSS  FHT's 156 bpm  gravid uterus , soft  scant serous blood noted on pad A: IUP 20 weeks, incompetent cervix with cerclage placement P: observe. Plan discharge in am

## 2014-12-17 NOTE — Addendum Note (Signed)
Addendum  created 12/17/14 0802 by Earmon PhoenixValerie P Lorana Maffeo, CRNA   Modules edited: Notes Section   Notes Section:  File: [id number]

## 2014-12-18 NOTE — Discharge Instructions (Signed)
Pelvic rest, mod bedrest at least until f/u visit

## 2014-12-18 NOTE — Discharge Summary (Signed)
Physician Discharge Summary  Patient ID: [name] [name] MRN: [id number] DOB/AGE: Jan 25, 1971 44 y.o.  Admit date: 12/16/2014 Discharge date: 12/18/2014  Admission Diagnoses:[id number] , incompt cx   Discharge Diagnoses: same, cerclage Active Problems:   Incompetent cervix during second trimester, antepartum   Discharged Condition: good  Hospital Course: seen by MFM for routine anat US>>>short cx noted, MFM rec cerclage>>>adm overnight for eval>>>stable  Consults: MFM  Significant Diagnostic Studies: labs:  CBC    Component Value Date/Time   WBC 7.2 12/16/2014 1730   RBC 3.74* 12/16/2014 1730   HGB 10.8* 12/16/2014 1730   HCT 31.7* 12/16/2014 1730   PLT 244 12/16/2014 1730   MCV 84.8 12/16/2014 1730   MCH 28.9 12/16/2014 1730   MCHC 34.1 12/16/2014 1730   RDW 14.4 12/16/2014 1730   LYMPHSABS 2.6 12/16/2014 1730   MONOABS 0.4 12/16/2014 1730   EOSABS 0.1 12/16/2014 1730   BASOSABS 0.0 12/16/2014 1730      Treatments: surgery: cerclage  Discharge Exam: Blood pressure 124/63, pulse 96, temperature 98.1 F (36.7 C), temperature source Oral, resp. rate 16, height 5' 6.5" (1.689 m), weight 301 lb (136.533 kg), last menstrual period 09/16/2014, SpO2 100 %. abd soft + bs, no c/o vag D/C or leaking  Disposition: Final discharge disposition not confirmed     Medication List    TAKE these medications        ferrous sulfate 325 (65 FE) MG tablet  Take 325 mg by mouth daily with breakfast.     prenatal multivitamin Tabs tablet  Take 1 tablet by mouth daily at 12 noon.           Follow-up Information    Follow up with Meriel PicaHOLLAND,Kham Zuckerman M, MD In 5 days.   Specialty:  Obstetrics and Gynecology   Why:  office will call   Contact information:   [address] [place] [id number] [phone]       Signed: Meriel PicaHOLLAND,Nanette Wirsing M 12/18/2014, 7:35 AM

## 2014-12-18 NOTE — Progress Notes (Signed)
Discharge instructions reviewed with patient.  Patient states understanding of home care, medications, activity, signs/symptoms to report to MD and return MD office visit.  Patients significant other and family will assist with her care @ home.  No home equipment needed.  Patient  Discharged per wheelchair in stable condition with staff without incident. 

## 2014-12-23 ENCOUNTER — Other Ambulatory Visit (HOSPITAL_COMMUNITY): Payer: Self-pay | Admitting: Maternal and Fetal Medicine

## 2014-12-23 DIAGNOSIS — O99212 Obesity complicating pregnancy, second trimester: Secondary | ICD-10-CM

## 2014-12-23 DIAGNOSIS — O09522 Supervision of elderly multigravida, second trimester: Secondary | ICD-10-CM

## 2014-12-30 ENCOUNTER — Encounter (HOSPITAL_COMMUNITY): Payer: Self-pay

## 2014-12-30 ENCOUNTER — Other Ambulatory Visit (HOSPITAL_COMMUNITY): Payer: Self-pay | Admitting: Maternal and Fetal Medicine

## 2014-12-30 ENCOUNTER — Ambulatory Visit (HOSPITAL_COMMUNITY)
Admission: RE | Admit: 2014-12-30 | Discharge: 2014-12-30 | Disposition: A | Discharge status: Home / Self Care | Payer: BLUE CROSS/BLUE SHIELD | Source: Ambulatory Visit | Attending: Obstetrics & Gynecology | Admitting: Obstetrics & Gynecology

## 2014-12-30 DIAGNOSIS — O26872 Cervical shortening, second trimester: Secondary | ICD-10-CM

## 2014-12-30 DIAGNOSIS — Z3A21 21 weeks gestation of pregnancy: Secondary | ICD-10-CM | POA: Diagnosis not present

## 2014-12-30 DIAGNOSIS — O09522 Supervision of elderly multigravida, second trimester: Secondary | ICD-10-CM

## 2014-12-30 DIAGNOSIS — O99212 Obesity complicating pregnancy, second trimester: Secondary | ICD-10-CM | POA: Diagnosis not present

## 2014-12-30 DIAGNOSIS — O3432 Maternal care for cervical incompetence, second trimester: Secondary | ICD-10-CM | POA: Diagnosis not present

## 2014-12-30 DIAGNOSIS — O09529 Supervision of elderly multigravida, unspecified trimester: Secondary | ICD-10-CM | POA: Insufficient documentation

## 2015-01-07 ENCOUNTER — Ambulatory Visit (HOSPITAL_COMMUNITY)
Admission: RE | Admit: 2015-01-07 | Discharge: 2015-01-07 | Disposition: A | Discharge status: Home / Self Care | Payer: BLUE CROSS/BLUE SHIELD | Source: Ambulatory Visit | Attending: Obstetrics & Gynecology | Admitting: Obstetrics & Gynecology

## 2015-01-07 ENCOUNTER — Encounter (HOSPITAL_COMMUNITY): Payer: Self-pay

## 2015-01-07 ENCOUNTER — Other Ambulatory Visit (HOSPITAL_COMMUNITY): Payer: Self-pay | Admitting: Maternal and Fetal Medicine

## 2015-01-07 DIAGNOSIS — D62 Acute posthemorrhagic anemia: Secondary | ICD-10-CM | POA: Diagnosis present

## 2015-01-07 DIAGNOSIS — O3482 Maternal care for other abnormalities of pelvic organs, second trimester: Secondary | ICD-10-CM | POA: Diagnosis present

## 2015-01-07 DIAGNOSIS — O09522 Supervision of elderly multigravida, second trimester: Secondary | ICD-10-CM | POA: Insufficient documentation

## 2015-01-07 DIAGNOSIS — Z3A23 23 weeks gestation of pregnancy: Secondary | ICD-10-CM | POA: Diagnosis present

## 2015-01-07 DIAGNOSIS — O9902 Anemia complicating childbirth: Secondary | ICD-10-CM | POA: Diagnosis present

## 2015-01-07 DIAGNOSIS — O3430 Maternal care for cervical incompetence, unspecified trimester: Secondary | ICD-10-CM | POA: Insufficient documentation

## 2015-01-07 DIAGNOSIS — O99212 Obesity complicating pregnancy, second trimester: Secondary | ICD-10-CM

## 2015-01-07 DIAGNOSIS — O3432 Maternal care for cervical incompetence, second trimester: Secondary | ICD-10-CM | POA: Insufficient documentation

## 2015-01-07 DIAGNOSIS — O99214 Obesity complicating childbirth: Secondary | ICD-10-CM | POA: Diagnosis present

## 2015-01-07 DIAGNOSIS — O42912 Preterm premature rupture of membranes, unspecified as to length of time between rupture and onset of labor, second trimester: Principal | ICD-10-CM | POA: Diagnosis present

## 2015-01-07 DIAGNOSIS — O26872 Cervical shortening, second trimester: Secondary | ICD-10-CM

## 2015-01-07 DIAGNOSIS — N8320 Unspecified ovarian cysts: Secondary | ICD-10-CM | POA: Diagnosis present

## 2015-01-07 DIAGNOSIS — Z6841 Body Mass Index (BMI) 40.0 and over, adult: Secondary | ICD-10-CM

## 2015-01-07 DIAGNOSIS — O328XX0 Maternal care for other malpresentation of fetus, not applicable or unspecified: Secondary | ICD-10-CM | POA: Diagnosis present

## 2015-01-07 MED ORDER — BETAMETHASONE SOD PHOS & ACET 6 (3-3) MG/ML IJ SUSP
12.0000 mg | Freq: Once | INTRAMUSCULAR | Status: AC
  Administered 2015-01-07: 12 mg via INTRAMUSCULAR
  Filled 2015-01-07: qty 2

## 2015-01-08 ENCOUNTER — Encounter (HOSPITAL_COMMUNITY)
Admission: AD | Disposition: A | Discharge status: Home / Self Care | Payer: Self-pay | Source: Ambulatory Visit | Attending: Obstetrics and Gynecology

## 2015-01-08 ENCOUNTER — Inpatient Hospital Stay (HOSPITAL_COMMUNITY): Payer: BLUE CROSS/BLUE SHIELD | Admitting: Certified Registered"

## 2015-01-08 ENCOUNTER — Inpatient Hospital Stay (HOSPITAL_COMMUNITY): Payer: BLUE CROSS/BLUE SHIELD

## 2015-01-08 ENCOUNTER — Inpatient Hospital Stay (HOSPITAL_COMMUNITY)
Admission: AD | Admit: 2015-01-08 | Discharge: 2015-01-12 | DRG: 765 | Disposition: A | Discharge status: Home / Self Care | Payer: BLUE CROSS/BLUE SHIELD | Source: Ambulatory Visit | Attending: Obstetrics and Gynecology | Admitting: Obstetrics and Gynecology

## 2015-01-08 ENCOUNTER — Encounter (HOSPITAL_COMMUNITY): Payer: Self-pay | Admitting: *Deleted

## 2015-01-08 ENCOUNTER — Ambulatory Visit (HOSPITAL_COMMUNITY): Payer: BLUE CROSS/BLUE SHIELD

## 2015-01-08 DIAGNOSIS — O09522 Supervision of elderly multigravida, second trimester: Secondary | ICD-10-CM | POA: Diagnosis not present

## 2015-01-08 DIAGNOSIS — Z98891 History of uterine scar from previous surgery: Secondary | ICD-10-CM

## 2015-01-08 DIAGNOSIS — O9902 Anemia complicating childbirth: Secondary | ICD-10-CM | POA: Diagnosis present

## 2015-01-08 DIAGNOSIS — O3482 Maternal care for other abnormalities of pelvic organs, second trimester: Secondary | ICD-10-CM | POA: Diagnosis present

## 2015-01-08 DIAGNOSIS — O42912 Preterm premature rupture of membranes, unspecified as to length of time between rupture and onset of labor, second trimester: Secondary | ICD-10-CM | POA: Diagnosis present

## 2015-01-08 DIAGNOSIS — Z3A23 23 weeks gestation of pregnancy: Secondary | ICD-10-CM | POA: Diagnosis present

## 2015-01-08 DIAGNOSIS — N8320 Unspecified ovarian cysts: Secondary | ICD-10-CM | POA: Diagnosis present

## 2015-01-08 DIAGNOSIS — D62 Acute posthemorrhagic anemia: Secondary | ICD-10-CM | POA: Diagnosis present

## 2015-01-08 DIAGNOSIS — O4290 Premature rupture of membranes, unspecified as to length of time between rupture and onset of labor, unspecified weeks of gestation: Secondary | ICD-10-CM | POA: Diagnosis present

## 2015-01-08 DIAGNOSIS — O3432 Maternal care for cervical incompetence, second trimester: Secondary | ICD-10-CM | POA: Diagnosis present

## 2015-01-08 DIAGNOSIS — O99214 Obesity complicating childbirth: Secondary | ICD-10-CM | POA: Diagnosis present

## 2015-01-08 DIAGNOSIS — O328XX0 Maternal care for other malpresentation of fetus, not applicable or unspecified: Secondary | ICD-10-CM | POA: Diagnosis present

## 2015-01-08 DIAGNOSIS — Z6841 Body Mass Index (BMI) 40.0 and over, adult: Secondary | ICD-10-CM | POA: Diagnosis not present

## 2015-01-08 LAB — URINE MICROSCOPIC-ADD ON

## 2015-01-08 LAB — URINALYSIS, ROUTINE W REFLEX MICROSCOPIC
Bilirubin Urine: NEGATIVE
GLUCOSE, UA: NEGATIVE mg/dL
KETONES UR: NEGATIVE mg/dL
NITRITE: NEGATIVE
Protein, ur: NEGATIVE mg/dL
Specific Gravity, Urine: >1.03 — ABNORMAL HIGH (ref 1.005–1.030)
Urobilinogen, UA: 0.2 mg/dL (ref 0.0–1.0)
pH: 5.5 (ref 5.0–8.0)

## 2015-01-08 LAB — WET PREP, GENITAL
Clue Cells Wet Prep HPF POC: NONE SEEN
TRICH WET PREP: NONE SEEN
YEAST WET PREP: NONE SEEN

## 2015-01-08 LAB — CBC
HCT: 29.4 % — ABNORMAL LOW (ref 36.0–46.0)
Hemoglobin: 10 g/dL — ABNORMAL LOW (ref 12.0–15.0)
MCH: 29.4 pg (ref 26.0–34.0)
MCHC: 34 g/dL (ref 30.0–36.0)
MCV: 86.5 fL (ref 78.0–100.0)
PLATELETS: 249 10*3/uL (ref 150–400)
RBC: 3.4 MIL/uL — ABNORMAL LOW (ref 3.87–5.11)
RDW: 14.1 % (ref 11.5–15.5)
WBC: 20.1 10*3/uL — ABNORMAL HIGH (ref 4.0–10.5)

## 2015-01-08 LAB — AMNISURE RUPTURE OF MEMBRANE (ROM) NOT AT ARMC: Amnisure ROM: POSITIVE

## 2015-01-08 SURGERY — Surgical Case
Anesthesia: General | Site: Abdomen

## 2015-01-08 MED ORDER — SODIUM CHLORIDE 0.9 % IV SOLN: 250.0000 mL | INTRAVENOUS | Status: DC

## 2015-01-08 MED ORDER — FENTANYL CITRATE 0.05 MG/ML IJ SOLN
INTRAMUSCULAR | Status: DC | PRN
  Administered 2015-01-08: 50 ug via INTRAVENOUS
  Administered 2015-01-08: 100 ug via INTRAVENOUS

## 2015-01-08 MED ORDER — HYDROMORPHONE HCL 2 MG PO TABS
2.0000 mg | ORAL_TABLET | ORAL | Status: DC | PRN
  Administered 2015-01-09 – 2015-01-11 (×2): 2 mg via ORAL
  Filled 2015-01-08 (×2): qty 1

## 2015-01-08 MED ORDER — LACTATED RINGERS IV SOLN
INTRAVENOUS | Status: DC
  Administered 2015-01-08 (×2): via INTRAVENOUS

## 2015-01-08 MED ORDER — AMOXICILLIN 500 MG PO CAPS: 500.0000 mg | ORAL_CAPSULE | Freq: Three times a day (TID) | ORAL | Status: DC

## 2015-01-08 MED ORDER — BETAMETHASONE SOD PHOS & ACET 6 (3-3) MG/ML IJ SUSP
12.0000 mg | Freq: Once | INTRAMUSCULAR | Status: AC
  Administered 2015-01-08: 12 mg via INTRAMUSCULAR
  Filled 2015-01-08: qty 2

## 2015-01-08 MED ORDER — FLEET ENEMA 7-19 GM/118ML RE ENEM: 1.0000 | ENEMA | Freq: Every day | RECTAL | Status: DC | PRN

## 2015-01-08 MED ORDER — MENTHOL 3 MG MT LOZG: 1.0000 | LOZENGE | OROMUCOSAL | Status: DC | PRN

## 2015-01-08 MED ORDER — MIDAZOLAM HCL 2 MG/2ML IJ SOLN
INTRAMUSCULAR | Status: AC
Start: 2015-01-08 — End: 2015-01-08
  Filled 2015-01-08: qty 2

## 2015-01-08 MED ORDER — HYDROMORPHONE HCL 1 MG/ML IJ SOLN
INTRAMUSCULAR | Status: DC | PRN
  Administered 2015-01-08 (×6): 0.5 mg via INTRAVENOUS

## 2015-01-08 MED ORDER — DEXAMETHASONE SODIUM PHOSPHATE 10 MG/ML IJ SOLN
INTRAMUSCULAR | Status: AC
  Filled 2015-01-08: qty 1

## 2015-01-08 MED ORDER — 0.9 % SODIUM CHLORIDE (POUR BTL) OPTIME
TOPICAL | Status: DC | PRN
  Administered 2015-01-08: 1000 mL

## 2015-01-08 MED ORDER — PRENATAL MULTIVITAMIN CH
1.0000 | ORAL_TABLET | Freq: Every day | ORAL | Status: DC
Start: 2015-01-08 — End: 2015-01-12
  Administered 2015-01-09 – 2015-01-11 (×3): 1 via ORAL
  Filled 2015-01-08 (×3): qty 1

## 2015-01-08 MED ORDER — LIDOCAINE HCL (CARDIAC) 20 MG/ML IV SOLN
INTRAVENOUS | Status: DC | PRN
  Administered 2015-01-08: 80 mg via INTRAVENOUS

## 2015-01-08 MED ORDER — LACTATED RINGERS IV BOLUS (SEPSIS)
1000.0000 mL | Freq: Once | INTRAVENOUS | Status: AC
  Administered 2015-01-08: 1000 mL via INTRAVENOUS

## 2015-01-08 MED ORDER — HYDROMORPHONE HCL 1 MG/ML IJ SOLN
INTRAMUSCULAR | Status: AC
  Filled 2015-01-08: qty 1

## 2015-01-08 MED ORDER — LIDOCAINE HCL (CARDIAC) 20 MG/ML IV SOLN
INTRAVENOUS | Status: AC
  Filled 2015-01-08: qty 5

## 2015-01-08 MED ORDER — ONDANSETRON HCL 4 MG/2ML IJ SOLN
INTRAMUSCULAR | Status: DC | PRN
  Administered 2015-01-08: 4 mg via INTRAVENOUS

## 2015-01-08 MED ORDER — TETANUS-DIPHTH-ACELL PERTUSSIS 5-2.5-18.5 LF-MCG/0.5 IM SUSP: 0.5000 mL | Freq: Once | INTRAMUSCULAR | Status: DC

## 2015-01-08 MED ORDER — TERBUTALINE SULFATE 1 MG/ML IJ SOLN
0.2500 mg | Freq: Once | INTRAMUSCULAR | Status: AC
  Administered 2015-01-08: 0.25 mg via SUBCUTANEOUS
  Filled 2015-01-08: qty 1

## 2015-01-08 MED ORDER — ROCURONIUM BROMIDE 100 MG/10ML IV SOLN
INTRAVENOUS | Status: AC
  Filled 2015-01-08: qty 1

## 2015-01-08 MED ORDER — OXYTOCIN 10 UNIT/ML IJ SOLN
40.0000 [IU] | INTRAMUSCULAR | Status: DC | PRN
  Administered 2015-01-08: 40 [IU] via INTRAVENOUS

## 2015-01-08 MED ORDER — ONDANSETRON HCL 4 MG PO TABS: 4.0000 mg | ORAL_TABLET | ORAL | Status: DC | PRN

## 2015-01-08 MED ORDER — ACETAMINOPHEN 325 MG PO TABS: 650.0000 mg | ORAL_TABLET | ORAL | Status: DC | PRN

## 2015-01-08 MED ORDER — LACTATED RINGERS IV SOLN
INTRAVENOUS | Status: DC | PRN
  Administered 2015-01-08: 19:00:00 via INTRAVENOUS

## 2015-01-08 MED ORDER — SODIUM CHLORIDE 0.9 % IV SOLN
2.0000 g | Freq: Four times a day (QID) | INTRAVENOUS | Status: DC
  Administered 2015-01-08: 2 g via INTRAVENOUS
  Filled 2015-01-08 (×5): qty 2000

## 2015-01-08 MED ORDER — KETOROLAC TROMETHAMINE 30 MG/ML IJ SOLN
INTRAMUSCULAR | Status: AC
  Filled 2015-01-08: qty 1

## 2015-01-08 MED ORDER — SIMETHICONE 80 MG PO CHEW
80.0000 mg | CHEWABLE_TABLET | ORAL | Status: DC
  Administered 2015-01-09 – 2015-01-10 (×3): 80 mg via ORAL
  Filled 2015-01-08 (×3): qty 1

## 2015-01-08 MED ORDER — CALCIUM CARBONATE ANTACID 500 MG PO CHEW: 2.0000 | CHEWABLE_TABLET | ORAL | Status: DC | PRN

## 2015-01-08 MED ORDER — AZITHROMYCIN 500 MG PO TABS: 500.0000 mg | ORAL_TABLET | Freq: Every day | ORAL | Status: DC

## 2015-01-08 MED ORDER — DEXTROSE 5 % IV SOLN
500.0000 mg | INTRAVENOUS | Status: DC
  Administered 2015-01-08: 500 mg via INTRAVENOUS
  Filled 2015-01-08 (×2): qty 500

## 2015-01-08 MED ORDER — SIMETHICONE 80 MG PO CHEW
80.0000 mg | CHEWABLE_TABLET | Freq: Three times a day (TID) | ORAL | Status: DC
  Administered 2015-01-09 – 2015-01-11 (×9): 80 mg via ORAL
  Filled 2015-01-08 (×10): qty 1

## 2015-01-08 MED ORDER — OXYTOCIN 40 UNITS IN LACTATED RINGERS INFUSION - SIMPLE MED: 62.5000 mL/h | INTRAVENOUS | Status: AC

## 2015-01-08 MED ORDER — ONDANSETRON HCL 4 MG/2ML IJ SOLN
4.0000 mg | INTRAMUSCULAR | Status: DC | PRN
  Administered 2015-01-09: 4 mg via INTRAVENOUS
  Filled 2015-01-08: qty 2

## 2015-01-08 MED ORDER — IBUPROFEN 600 MG PO TABS
600.0000 mg | ORAL_TABLET | Freq: Four times a day (QID) | ORAL | Status: DC
  Administered 2015-01-09 – 2015-01-12 (×13): 600 mg via ORAL
  Filled 2015-01-08 (×14): qty 1

## 2015-01-08 MED ORDER — DOCUSATE SODIUM 100 MG PO CAPS: 100.0000 mg | ORAL_CAPSULE | Freq: Every day | ORAL | Status: DC

## 2015-01-08 MED ORDER — PROPOFOL 10 MG/ML IV BOLUS
INTRAVENOUS | Status: AC
  Filled 2015-01-08: qty 20

## 2015-01-08 MED ORDER — HYDROMORPHONE HCL 1 MG/ML IJ SOLN
1.0000 mg | Freq: Once | INTRAMUSCULAR | Status: AC
  Administered 2015-01-08: 1 mg via INTRAVENOUS
  Filled 2015-01-08: qty 1

## 2015-01-08 MED ORDER — SIMETHICONE 80 MG PO CHEW: 80.0000 mg | CHEWABLE_TABLET | ORAL | Status: DC | PRN

## 2015-01-08 MED ORDER — HYDROMORPHONE HCL 1 MG/ML IJ SOLN
0.2500 mg | INTRAMUSCULAR | Status: DC | PRN
  Administered 2015-01-08 (×2): 0.5 mg via INTRAVENOUS

## 2015-01-08 MED ORDER — DIBUCAINE 1 % RE OINT: 1.0000 "application " | TOPICAL_OINTMENT | RECTAL | Status: DC | PRN

## 2015-01-08 MED ORDER — LANOLIN HYDROUS EX OINT: 1.0000 "application " | TOPICAL_OINTMENT | CUTANEOUS | Status: DC | PRN

## 2015-01-08 MED ORDER — SODIUM CHLORIDE 0.9 % IJ SOLN: 3.0000 mL | INTRAMUSCULAR | Status: DC | PRN

## 2015-01-08 MED ORDER — SUCCINYLCHOLINE CHLORIDE 20 MG/ML IJ SOLN
INTRAMUSCULAR | Status: DC | PRN
  Administered 2015-01-08: 120 mg via INTRAVENOUS

## 2015-01-08 MED ORDER — DIPHENHYDRAMINE HCL 25 MG PO CAPS: 25.0000 mg | ORAL_CAPSULE | Freq: Four times a day (QID) | ORAL | Status: DC | PRN

## 2015-01-08 MED ORDER — PROPOFOL 10 MG/ML IV BOLUS
INTRAVENOUS | Status: DC | PRN
  Administered 2015-01-08: 200 mg via INTRAVENOUS

## 2015-01-08 MED ORDER — ZOLPIDEM TARTRATE 5 MG PO TABS
5.0000 mg | ORAL_TABLET | Freq: Every evening | ORAL | Status: DC | PRN
Start: 2015-01-08 — End: 2015-01-09

## 2015-01-08 MED ORDER — ONDANSETRON HCL 4 MG/2ML IJ SOLN: 4.0000 mg | Freq: Once | INTRAMUSCULAR | Status: DC | PRN

## 2015-01-08 MED ORDER — SODIUM CHLORIDE 0.9 % IV SOLN
250.0000 mL | INTRAVENOUS | Status: DC | PRN
Start: 2015-01-08 — End: 2015-01-09

## 2015-01-08 MED ORDER — MIDAZOLAM HCL 2 MG/2ML IJ SOLN
INTRAMUSCULAR | Status: DC | PRN
  Administered 2015-01-08: 2 mg via INTRAVENOUS

## 2015-01-08 MED ORDER — LACTATED RINGERS IV SOLN
INTRAVENOUS | Status: DC
  Administered 2015-01-09: 09:00:00 via INTRAVENOUS

## 2015-01-08 MED ORDER — KETOROLAC TROMETHAMINE 30 MG/ML IJ SOLN
30.0000 mg | Freq: Once | INTRAMUSCULAR | Status: AC
  Administered 2015-01-08: 30 mg via INTRAVENOUS

## 2015-01-08 MED ORDER — BISACODYL 10 MG RE SUPP: 10.0000 mg | Freq: Every day | RECTAL | Status: DC | PRN

## 2015-01-08 MED ORDER — FENTANYL CITRATE 0.05 MG/ML IJ SOLN
INTRAMUSCULAR | Status: AC
  Filled 2015-01-08: qty 5

## 2015-01-08 MED ORDER — SCOPOLAMINE 1 MG/3DAYS TD PT72
MEDICATED_PATCH | TRANSDERMAL | Status: DC | PRN
  Administered 2015-01-08: 1 via TRANSDERMAL

## 2015-01-08 MED ORDER — SODIUM CHLORIDE 0.9 % IJ SOLN: 3.0000 mL | Freq: Two times a day (BID) | INTRAMUSCULAR | Status: DC

## 2015-01-08 MED ORDER — SCOPOLAMINE 1 MG/3DAYS TD PT72
MEDICATED_PATCH | TRANSDERMAL | Status: AC
  Filled 2015-01-08: qty 1

## 2015-01-08 MED ORDER — WITCH HAZEL-GLYCERIN EX PADS: 1.0000 "application " | MEDICATED_PAD | CUTANEOUS | Status: DC | PRN

## 2015-01-08 MED ORDER — SUCCINYLCHOLINE CHLORIDE 20 MG/ML IJ SOLN
INTRAMUSCULAR | Status: AC
  Filled 2015-01-08: qty 1

## 2015-01-08 MED ORDER — HYDROMORPHONE HCL 1 MG/ML IJ SOLN
INTRAMUSCULAR | Status: AC
  Administered 2015-01-08: 0.5 mg via INTRAVENOUS
  Filled 2015-01-08: qty 1

## 2015-01-08 MED ORDER — CITRIC ACID-SODIUM CITRATE 334-500 MG/5ML PO SOLN
ORAL | Status: AC
  Administered 2015-01-08: 30 mL via ORAL
  Filled 2015-01-08: qty 15

## 2015-01-08 MED ORDER — ONDANSETRON HCL 4 MG/2ML IJ SOLN
INTRAMUSCULAR | Status: AC
  Filled 2015-01-08: qty 2

## 2015-01-08 MED ORDER — LACTATED RINGERS IV SOLN
INTRAVENOUS | Status: DC | PRN
  Administered 2015-01-08 (×2): via INTRAVENOUS

## 2015-01-08 MED ORDER — SODIUM CHLORIDE 0.9 % IJ SOLN
3.0000 mL | Freq: Two times a day (BID) | INTRAMUSCULAR | Status: DC
Start: 2015-01-08 — End: 2015-01-09

## 2015-01-08 SURGICAL SUPPLY — 32 items
CLAMP CORD UMBIL (MISCELLANEOUS) IMPLANT
CLOTH BEACON ORANGE TIMEOUT ST (SAFETY) ×3 IMPLANT
DRAPE SHEET LG 3/4 BI-LAMINATE (DRAPES) IMPLANT
DRSG OPSITE POSTOP 4X10 (GAUZE/BANDAGES/DRESSINGS) ×3 IMPLANT
DURAPREP 26ML APPLICATOR (WOUND CARE) ×3 IMPLANT
ELECT REM PT RETURN 9FT ADLT (ELECTROSURGICAL) ×3
ELECTRODE REM PT RTRN 9FT ADLT (ELECTROSURGICAL) ×1 IMPLANT
EXTRACTOR VACUUM M CUP 4 TUBE (SUCTIONS) IMPLANT
EXTRACTOR VACUUM M CUP 4' TUBE (SUCTIONS)
GLOVE BIO SURGEON STRL SZ7 (GLOVE) ×3 IMPLANT
GOWN STRL REUS W/TWL LRG LVL3 (GOWN DISPOSABLE) ×6 IMPLANT
KIT ABG SYR 3ML LUER SLIP (SYRINGE) ×3 IMPLANT
LIQUID BAND (GAUZE/BANDAGES/DRESSINGS) ×3 IMPLANT
NDL HYPO 25X5/8 SAFETYGLIDE (NEEDLE) ×1 IMPLANT
NEEDLE HYPO 25X5/8 SAFETYGLIDE (NEEDLE) ×3 IMPLANT
NS IRRIG 1000ML POUR BTL (IV SOLUTION) ×3 IMPLANT
PACK C SECTION WH (CUSTOM PROCEDURE TRAY) ×3 IMPLANT
PAD ABD 7.5X8 STRL (GAUZE/BANDAGES/DRESSINGS) ×2 IMPLANT
PAD OB MATERNITY 4.3X12.25 (PERSONAL CARE ITEMS) ×3 IMPLANT
SPONGE GAUZE 4X4 12PLY STER LF (GAUZE/BANDAGES/DRESSINGS) ×2 IMPLANT
SPONGE LAP 18X18 X RAY DECT (DISPOSABLE) ×8 IMPLANT
STAPLER VISISTAT 35W (STAPLE) IMPLANT
SUT CHROMIC 0 CTX 36 (SUTURE) ×14 IMPLANT
SUT MON AB 4-0 PS1 27 (SUTURE) ×3 IMPLANT
SUT PDS AB 0 CT 36 (SUTURE) ×3 IMPLANT
SUT PLAIN 0 NONE (SUTURE) IMPLANT
SUT PLAIN 2 0 XLH (SUTURE) ×2 IMPLANT
SUT VIC AB 3-0 CT1 27 (SUTURE) ×9
SUT VIC AB 3-0 CT1 TAPERPNT 27 (SUTURE) ×1 IMPLANT
TOWEL OR 17X24 6PK STRL BLUE (TOWEL DISPOSABLE) ×3 IMPLANT
TRAY FOLEY CATH 14FR (SET/KITS/TRAYS/PACK) ×3 IMPLANT
YANKAUER SUCT BULB TIP NO VENT (SUCTIONS) ×2 IMPLANT

## 2015-01-08 NOTE — Consult Note (Signed)
Neonatology Note:   Attendance at C-section:    I was asked by Dr. Arelia SneddonMcComb to attend this Stat C/S at 23 1/7 weeks due to FHR in the 70s following onset of vaginal bleeding. The mother is a 44 yo G3P0A2 O pos, GBS neg with ovarian cyst and cerclage placed on 1/11. Patient got Ampicillin and Zithromax 3-4 hours before delivery and was afebrile. ROM 6 hours prior to delivery, fluid bloody. Mother also got 2 doses of Betamethasone, the second today about 4 hours before delivery. Infant delivered footling breech and was flaccid, apneic, and blue with a HR of 30 at birth. We bulb suctioned and got some purulent material from the pharynx. I applied neopuff PPV and the HR went up slightly, but was still low at about 60. During PPV, she was dried quickly, cord clamped and cut, and we placed her into the portawarmer bag. I intubated her atraumatically on the first attempt (3 min of life) with a 2.5 mm ETT, which was a tight fit, to a depth of 6 cm at the lips. It required about 28-30 mm of pressure to see any chest excursion, but the CO2 detector turned yellow right away and equal breath sounds could be heard. The HR came up to about 100-110. We placed a pulse oximeter and secured the ETT. I rechecked breath sounds, found them to be equal bilaterally, and we gave 1.3 ml of Curosurf via the ETT at 7 minutes of life. She tolerated it well. We continued PPV and were noticing some spontaneous breaths in addition. She was transported to the NICU for further care. MGM saw the baby en route, father was not yet present, mother under general anesthesia. Ap 1/4/6.    Doretha Souhristie C. Kahlani Graber, MD

## 2015-01-08 NOTE — MAU Note (Signed)
Cramping started last night.  Had this before- stopped when had bm.... Did not stop today, still cramping. No bleeding or leaking.

## 2015-01-08 NOTE — Transfer of Care (Signed)
Immediate Anesthesia Transfer of Care Note  Patient: [name] [name]  Procedure(s) Performed: Procedure(s): CESAREAN SECTION (N/A)  Patient Location: PACU  Anesthesia Type:General  Level of Consciousness: awake, alert  and oriented  Airway & Oxygen Therapy: Patient Spontanous Breathing and Patient connected to nasal cannula oxygen  Post-op Assessment: Report given to RN, Post -op Vital signs reviewed and stable and Patient moving all extremities  Post vital signs: Reviewed and stable  Last Vitals:  Filed Vitals:   01/08/15 2030  BP: 142/61  Pulse: 110  Temp: 36.8 C  Resp: 24    Complications: No apparent anesthesia complications

## 2015-01-08 NOTE — H&P (Signed)
[name] [name] is a 44 y.o. female presenting at 723.1 with PPROM. PNC complicated by thinning cervix. Underwent cerclage on 12/16/14.  Presented to mau c/o pain.  Minimal irritability noted.  Had SROM. Admitted for management. Maternal Medical History:  Reason for admission: Rupture of membranes.   Contractions: Onset was less than 1 hour ago.   Frequency: rare.    Fetal activity: Perceived fetal activity is normal.    Prenatal complications: Incompetent cervix with cerclage    OB History    Gravida Para Term Preterm AB TAB SAB Ectopic Multiple Living   3 0 0 0 2 0 2 0 0 0      Past Medical History  Diagnosis Date  . Ovarian cyst 03/2013    in [place]   Past Surgical History  Procedure Laterality Date  . Dilation and curettage of uterus    . Ovarian cyst removal    . Cervical cerclage N/A 12/16/2014    Procedure: CERCLAGE CERVICAL;  Surgeon: Leslie AndreaJames E Tomblin II, MD;  Location: [facility];  Service: Gynecology;  Laterality: N/A;   Family History: family history is not on file. She was adopted. Social History:  reports that she has never smoked. She has never used smokeless tobacco. She reports that she drinks about 0.5 oz of alcohol per week. She reports that she does not use illicit drugs.   Prenatal Transfer Tool  Maternal Diabetesna Genetic Screening: Normal Maternal Ultrasounds/Referrals: Abnormal:  Findings:   Other: thinning cervix with funneling Fetal Ultrasounds or other Referrals:  None Maternal Substance Abuse:  No Significant Maternal Medications:  None Significant Maternal Lab Results:  None Other Comments:  None  ROS  Dilation: Closed Exam by:: J Rasch NP Blood pressure 156/84, pulse 108, temperature 98.9 F (37.2 C), temperature source Oral, resp. rate 18, height 5\' 6"  (1.676 m), weight 136.986 kg (302 lb), last menstrual period 09/16/2014. Maternal Exam:  Uterine Assessment: Contraction strength is mild.  Contraction frequency is rare.   Abdomen: Patient  reports no abdominal tenderness. Fundal height is c/w dates.    Introitus: Amniotic fluid character: clear.  Cervix: Cervix close per nurse exam  Physical Exam  Constitutional: She appears well-developed and well-nourished.  Cardiovascular: Normal rate, regular rhythm and normal heart sounds.   Respiratory: Effort normal and breath sounds normal.  Genitourinary:  Clear fluid amniosure positive    Prenatal labs: ABO, Rh:   Antibody:   Rubella:   RPR:    HBsAg:    HIV:    GBS:     Assessment/Plan: IUP at 23.1 with PPROM Prior cervical thinning s/p cerclage Admitt.  Check sono.  Antibiotics. Receiving second dose of BMZ this afternoon.  Risk of preterm delivery discussed.  NICU consult   Cyera Balboni S 01/08/2015, 2:20 PM

## 2015-01-08 NOTE — Anesthesia Procedure Notes (Signed)
Procedure Name: Intubation Date/Time: 01/08/2015 7:16 PM Performed by: [name] [name], [name] Rowe L Pre-anesthesia Checklist: Patient identified, Emergency Drugs available, Suction available, Patient being monitored and Timeout performed Patient Re-evaluated:Patient Re-evaluated prior to inductionOxygen Delivery Method: Circle system utilized Preoxygenation: Pre-oxygenation with 100% oxygen Intubation Type: IV induction, Rapid sequence and Cricoid Pressure applied Laryngoscope Size: Mac and 4 Grade View: Grade I Tube type: Oral Tube size: 7.0 mm Number of attempts: 1 Placement Confirmation: ETT inserted through vocal cords under direct vision,  positive ETCO2,  CO2 detector and breath sounds checked- equal and bilateral Secured at: 20 cm Tube secured with: Tape

## 2015-01-08 NOTE — Progress Notes (Signed)
Pt in bathroom when called out to c/o bleeding.  Approx 350cc frank blood noted on peripad with clear fluid.  Pt assisted back to bed and monitors reapplied.  IV bolus started while pt in bathroom.

## 2015-01-08 NOTE — Anesthesia Preprocedure Evaluation (Signed)
Anesthesia Evaluation  Patient identified by MRN, date of birth, ID band Patient awake    Reviewed: Allergy & Precautions, NPO status , Patient's Chart, lab work & pertinent test resultsPreop documentation limited or incomplete due to emergent nature of procedure.  History of Anesthesia Complications Negative for: history of anesthetic complications  Airway Mallampati: III       Dental  (+) Dental Advisory Given   Pulmonary neg pulmonary ROS,          Cardiovascular negative cardio ROS  Rhythm:Regular     Neuro/Psych negative neurological ROS  negative psych ROS   GI/Hepatic negative GI ROS, Neg liver ROS,   Endo/Other  Morbid obesity  Renal/GU negative Renal ROS  negative genitourinary   Musculoskeletal negative musculoskeletal ROS (+)   Abdominal   Peds negative pediatric ROS (+)  Hematology negative hematology ROS (+)   Anesthesia Other Findings   Reproductive/Obstetrics (+) Pregnancy                             Anesthesia Physical Anesthesia Plan  ASA: III and emergent  Anesthesia Plan: General   Post-op Pain Management:    Induction: Rapid sequence, Cricoid pressure planned and Intravenous  Airway Management Planned: Oral ETT  Additional Equipment:   Intra-op Plan:   Post-operative Plan:   Informed Consent: I have reviewed the patients History and Physical, chart, labs and discussed the procedure including the risks, benefits and alternatives for the proposed anesthesia with the patient or authorized representative who has indicated his/her understanding and acceptance.   Dental advisory given and Only emergency history available  Plan Discussed with: CRNA  Anesthesia Plan Comments:         Anesthesia Quick Evaluation

## 2015-01-08 NOTE — MAU Note (Signed)
Pt presents to MAU with complaints of lower abdominal cramping since last night. Had cerclage placed on January the 11th for a short cervix

## 2015-01-08 NOTE — MAU Provider Note (Signed)
History     CSN: [id number]  Arrival date and time: 01/08/15 0850   First Provider Initiated Contact with Patient 01/08/15 0932      Chief Complaint  Patient presents with  . Abdominal Cramping   HPI  [name] [name] is a 44 y.o. G3P0020 at [redacted]w[redacted]d presenting to the MAU with a chief complaint of abdominal cramping. She has had two previous miscarriages and has a cervical cerclage for shortened cervix. Pt reports cramping began at 2030 last evening and describes the pain as "coming and going" and rates it 5/10. She does not recall a time or pattern between the cramping. Pt has been told to avoid any straining or stressing due to shortened cervix and cerclage, and is concerned about hitting a speed bump at high speed yesterday. States she had a sharp pain and had to lie down upon reaching home, which is when cramping began. Pt reports she experienced a similar episode 1 week prior, which resolved with bowel movement. States the cramping kept her up all night, and she decided to come in this morning when the pain did not resolve with bowel movement. Reports lying on her back helps alleviate the pain, but is unaware of aggravating factors. Denies any vaginal bleeding, but reports vaginal discharge and dysuria, both which have been determined benign by her current prenatal care.   OB History    Gravida Para Term Preterm AB TAB SAB Ectopic Multiple Living        Past Medical History  Diagnosis Date  . Ovarian cyst 03/2013    in [place]    Past Surgical History  Procedure Laterality Date  . Dilation and curettage of uterus    . Ovarian cyst removal    . Cervical cerclage N/A 12/16/2014    Procedure: CERCLAGE CERVICAL;  Surgeon: Leslie Andrea, MD;  Location: [facility];  Service: Gynecology;  Laterality: N/A;    Family History  Problem Relation Age of Onset  . Adopted: Yes    History  Substance Use Topics  . Smoking status: Never Smoker   . Smokeless tobacco: Never  Used  . Alcohol Use: 0.5 oz/week    1 Not specified per week     Comment: social    Allergies:  Allergies  Allergen Reactions  . Hydrocodone Nausea And Vomiting and Palpitations    Causes nightmares    Prescriptions prior to admission  Medication Sig Dispense Refill Last Dose  . ferrous sulfate 325 (65 FE) MG tablet Take 325 mg by mouth daily with breakfast.   01/07/2015 at Unknown time  . Prenatal Vit-Fe Fumarate-FA (PRENATAL MULTIVITAMIN) TABS tablet Take 1 tablet by mouth daily at 12 noon.   01/07/2015 at Unknown time  . PROGESTERONE VA Place vaginally.   01/07/2015 at Unknown time   Results for orders placed or performed during the hospital encounter of 01/08/15 (from the past 48 hour(s))  Urinalysis, Routine w reflex microscopic     Status: Abnormal   Collection Time: 01/08/15  9:00 AM  Result Value Ref Range   Color, Urine YELLOW YELLOW   APPearance CLEAR CLEAR   Specific Gravity, Urine >1.030 (H) 1.005 - 1.030   pH 5.5 5.0 - 8.0   Glucose, UA NEGATIVE NEGATIVE mg/dL   Hgb urine dipstick TRACE (A) NEGATIVE   Bilirubin Urine NEGATIVE NEGATIVE   Ketones, ur NEGATIVE NEGATIVE mg/dL   Protein, ur NEGATIVE NEGATIVE mg/dL   Urobilinogen, UA  0.2 0.0 - 1.0 mg/dL   Nitrite NEGATIVE NEGATIVE   Leukocytes, UA MODERATE (A) NEGATIVE  Urine microscopic-add on     Status: Abnormal   Collection Time: 01/08/15  9:00 AM  Result Value Ref Range   Squamous Epithelial / LPF FEW (A) RARE   WBC, UA 21-50 <3 WBC/hpf   RBC / HPF 0-2 <3 RBC/hpf   Bacteria, UA RARE RARE   Urine-Other MUCOUS PRESENT   Wet prep, genital     Status: Abnormal   Collection Time: 01/08/15 10:10 AM  Result Value Ref Range   Yeast Wet Prep HPF POC NONE SEEN NONE SEEN   Trich, Wet Prep NONE SEEN NONE SEEN   Clue Cells Wet Prep HPF POC NONE SEEN NONE SEEN   WBC, Wet Prep HPF POC MODERATE (A) NONE SEEN    Comment: FEW BACTERIA SEEN  Amnisure rupture of membrane (rom)     Status: None   Collection Time: 01/08/15  1:20  PM  Result Value Ref Range   Amnisure ROM POSITIVE     Review of Systems  Constitutional: Negative for fever and chills.  Gastrointestinal: Positive for abdominal pain (Lower abdominal cramping ). Negative for nausea, vomiting, diarrhea and constipation.  Genitourinary: Negative for dysuria, urgency, frequency and hematuria.   Physical Exam   Blood pressure 156/84, pulse 108, temperature 98.9 F (37.2 C), temperature source Oral, resp. rate 18, height 5\' 6"  (1.676 m), weight 136.986 kg (302 lb), last menstrual period 09/16/2014.  Physical Exam  Constitutional: She is oriented to person, place, and time. She appears well-developed and well-nourished. No distress.  HENT:  Head: Normocephalic.  Eyes: Pupils are equal, round, and reactive to light.  Neck: Neck supple.  Cardiovascular: Normal rate and normal heart sounds.   Respiratory: Effort normal and breath sounds normal.  GI: Soft. She exhibits no distension. There is no tenderness.  Genitourinary:  Speculum exam: Vagina - Small amount of creamy, yellow discharge, no odor Cervix - No contact bleeding, no active bleeding. Difficult to visualize the cervix due to patients discomfort of exam. Bimanual exam: Cervix closed, cerclage felt  Adnexa non tender, no masses bilaterally Chaperone present for exam     Musculoskeletal: Normal range of motion.  Neurological: She is alert and oriented to person, place, and time.  Skin: Skin is warm. She is not diaphoretic.  Psychiatric: Her behavior is normal.   Fetal Tracing: Baseline: 150 bpm  Variability: Moderate  Accelerations: 10x10 Decelerations: none  Toco: occasional UI   MAU Course  Procedures  None  MDM Urine culture pending Dr. Arelia SneddonMcComb consulted  Terb X 1 dose LR bolus Dilaudid 1 mg given IV for pain  Patient rates her pain 5/10 after pain medication  Patient states that she feels leaking of fluid. Patient informed RN who informed NP > amnisure sent Dr. Arelia SneddonMcComb  notified of amnisure positive Beta methasone #2 given today in MAU at 1400 US for fluid and presentation ordered Patient to be admitted to Ante per Dr. Arelia SneddonMcComb  Assessment and Plan   A:  PROM Betamethasone complete  Uterine irritability   P:  Admit to Ante per Dr. Tammi SouMcComb  Gradyn Shein Irene Jamika Sadek, NP 01/08/2015 2:59 PM

## 2015-01-08 NOTE — Brief Op Note (Signed)
01/08/2015  8:44 PM  PATIENT:  [name] [name]  44 y.o. female  PRE-OPERATIVE DIAGNOSIS:  STAT C-SECTION Breech  POST-OPERATIVE DIAGNOSIS:  STAT C-SECTION Breech  PROCEDURE:  Procedure(s): CESAREAN SECTION (N/A)  SURGEON:  Surgeon(s) and Role:    * Juluis MireJohn S Jesi Jurgens, MD - Primary  PHYSICIAN ASSISTANT:   ASSISTANTS: none   ANESTHESIA:   general  EBL:  Total I/O In: 2300 [I.V.:2300] Out: 550 [Urine:50; Blood:500]  BLOOD ADMINISTERED:none  DRAINS: Urinary Catheter (Foley)   LOCAL MEDICATIONS USED:  NONE  SPECIMEN:  Source of Specimen:  placenta  DISPOSITION OF SPECIMEN:  PATHOLOGY  COUNTS:  YES  TOURNIQUET:  * No tourniquets in log *  DICTATION: .Other Dictation: Dictation Number [id number]  PLAN OF CARE: Admit to inpatient   PATIENT DISPOSITION:  PACU - hemodynamically stable.   Delay start of Pharmacological VTE agent (>24hrs) due to surgical blood loss or risk of bleeding: no

## 2015-01-08 NOTE — MAU Note (Signed)
Pt complains of an increase in vaginal discharge, noticed large amount of discharge on perineum. J Rasch NP notified of findings and orders received for [facility] Aamnisure

## 2015-01-08 NOTE — Progress Notes (Signed)
POC for stat C/S initiated.

## 2015-01-08 NOTE — Progress Notes (Signed)
Patient ID: [name] [name], female   DOB: 11-Aug-1971, [age] y.o.   MRN: [id number] On sono fetus is breech still good pockets of amniotic fluid Discussed expectant management and risk of PPROM and preterm delivery Did receive second dose of BMZ

## 2015-01-08 NOTE — Progress Notes (Signed)
Patient ID: [name] [name], female   DOB: [birth year], 44 y.o.   MRN: [id number] Patient had a gush of bloody fluid fhr in the 70's On exam either the lower uterine segment or buttock was well down in the vagina  Decided to precede with primary cesarean section Risk of cesarean section discussed.  These include:  Risk of infection;  Risk of hemorrhage that could require transfusions with the associated risk of aids or hepatitis;  Excessive bleeding could require hysterectomy;  Risk of injury to adjacent organs including bladder, bowel or ureters;  Risk of DVT's and possible pulmonary embolus.  Patient expresses a understanding of indications and risks.;

## 2015-01-09 ENCOUNTER — Encounter (HOSPITAL_COMMUNITY): Payer: Self-pay | Admitting: Obstetrics

## 2015-01-09 LAB — CBC
HCT: 19.2 % — ABNORMAL LOW (ref 36.0–46.0)
HCT: 17.8 % — ABNORMAL LOW (ref 36.0–46.0)
HEMOGLOBIN: 6.7 g/dL — AB (ref 12.0–15.0)
HEMOGLOBIN: 5.9 g/dL — AB (ref 12.0–15.0)
MCH: 30.5 pg (ref 26.0–34.0)
MCH: 29.5 pg (ref 26.0–34.0)
MCHC: 34.9 g/dL (ref 30.0–36.0)
MCHC: 33.1 g/dL (ref 30.0–36.0)
MCV: 87.3 fL (ref 78.0–100.0)
MCV: 89 fL (ref 78.0–100.0)
Platelets: 216 10*3/uL (ref 150–400)
Platelets: 210 10*3/uL (ref 150–400)
RBC: 2.2 MIL/uL — ABNORMAL LOW (ref 3.87–5.11)
RBC: 2 MIL/uL — AB (ref 3.87–5.11)
RDW: 14.5 % (ref 11.5–15.5)
RDW: 14.2 % (ref 11.5–15.5)
WBC: 17.6 10*3/uL — ABNORMAL HIGH (ref 4.0–10.5)
WBC: 17.9 10*3/uL — ABNORMAL HIGH (ref 4.0–10.5)

## 2015-01-09 LAB — RUBELLA SCREEN: Rubella: 1.19 index (ref >0.99)

## 2015-01-09 LAB — DIFFERENTIAL
BASOS ABS: 0 10*3/uL (ref 0.0–0.1)
BASOS PCT: 0 % (ref 0–1)
EOS ABS: 0 10*3/uL (ref 0.0–0.7)
EOS PCT: 0 % (ref 0–5)
LYMPHS ABS: 2.3 10*3/uL (ref 0.7–4.0)
LYMPHS PCT: 13 % (ref 12–46)
MONOS PCT: 6 % (ref 3–12)
Monocytes Absolute: 1.1 10*3/uL — ABNORMAL HIGH (ref 0.1–1.0)
NEUTROS ABS: 14.2 10*3/uL — AB (ref 1.7–7.7)
NEUTROS PCT: 81 % — AB (ref 43–77)

## 2015-01-09 LAB — URINE CULTURE: Special Requests: NORMAL

## 2015-01-09 LAB — HEPATITIS B SURFACE ANTIGEN: Hepatitis B Surface Ag: NEGATIVE

## 2015-01-09 LAB — ABO/RH: ABO/RH(D): O POS

## 2015-01-09 MED ORDER — LACTATED RINGERS IV BOLUS (SEPSIS)
500.0000 mL | Freq: Once | INTRAVENOUS | Status: AC
  Administered 2015-01-09: 500 mL via INTRAVENOUS

## 2015-01-09 NOTE — Op Note (Signed)
NAME:  [name], [name]                ACCOUNT NO.:  [id number]  MEDICAL RECORD NO.:  [id number]  LOCATION:  9303                          FACILITY:  [facility]  PHYSICIAN:  Juluis MireJohn S. Oval Cavazos, M.D.   DATE OF BIRTH:  1971-10-11  DATE OF PROCEDURE:  01/08/2015 DATE OF DISCHARGE:                              OPERATIVE REPORT   PREOPERATIVE DIAGNOSIS:  Intrauterine pregnancy at 23 weeks and 1 day with premature rupture of membranes.  Cerclage in place.  The infant in the breech presentation.  Subsequent bleeding and gush of fluid.  Felt like the breech was presenting in the vagina versus the lower uterine segment.  POSTOPERATIVE DIAGNOSIS:  Intrauterine pregnancy at 23 weeks and 1 day with premature rupture of membranes.  Cerclage in place.  The infant in the breech presentation.  Subsequent bleeding and gush of fluid.  Felt like the breech was presenting in the vagina versus the lower uterine segment.  PROCEDURE:  Urgent low vertical cesarean section.  SURGEON:  Juluis MireJohn S. Travonne Schowalter, M.D.  ANESTHESIA:  General endotracheal.  ESTIMATED BLOOD LOSS:  800 mL.  PACKS:  None.  DRAINS:  Included urethral Foley.  INTRAOPERATIVE BLOOD PLACED:  None.  COMPLICATION:  None.  INDICATIONS:  The patient is a 44 year old female at 23 weeks and 1 day. She had complications of a thinning uterine segment, had a cerclage put in place.  She was seen in maternity admission with some discomfort. Minimal uterine activity was noted.  Subsequently had gush of fluid which was positive for the AmniSure.  She was admitted to antenatal. She had a subsequent gush of bloody fluid.  Fetal heart rate dropped into the 70s on exam, which was difficult.  The patient did not tolerate it well.  Felt that either the breech was well down into the vagina or possibly the breech in the lower uterine segment, could not tell.  The decision was to proceed with emergent primary cesarean section.  Risks were discussed as previously  noted.  PROCEDURE IN DETAIL:  The patient was taken to the OR and placed in supine position with left lateral tilt.  The abdomen was prepped out with Betadine and draped in sterile field.  After satisfactory level of general anesthesia obtained, a low transverse skin incision was made with knife, carried through the subcutaneous tissue.  Anterior rectus fascia was entered sharply and incision of the fascia was extended laterally.  The fascia taken off the muscle superiorly and inferiorly. Rectus muscles were inspected and muscles separated in the midline. Perineum was entered sharply.  She did have adhesions to the uterine fundus.  However, we were able to make a low vertical uterine incision, extended superiorly with bandage scissors.  There was actually a footling breech presentation.  The infant was delivered.  The infant was a viable female, Apgars were 1, 4, and 6.  Baby was taken to the neuro intensive care unit.  After delivery, seemed to be stable.  Placenta was delivered manually and sent to Pathology.  We exteriorized the uterus. There was small bowel adhered to the uterine fundus.  This was taken down using sharp dissection with no injury to the bowel.  She  did have some omental adhesions also that were taken down using sharp dissection. At this point in time, the low vertical incision was closed with multiple running locking sutures of 0 chromic until we had it completely closed and good hemostasis.  Urine output remained clear and adequate. The bladder was well down below the uterine incision.  There were adhesions around both tubes and ovaries.  We took those down the best we could.  She had omental adhesions that we took down.  We pulled the uterus forward and examined the bowel.  There were still some adhesions that were taken down.  We then followed the small intestines, and there was no evidence of injury to the bowel from the previous dissection. The uterus was then  returned to the abdominal cavity.  We irrigated the pelvis.  We had good hemostasis and clear urine output.  Peritoneum closed in a running suture of 2-0 Vicryl.  Fascia was closed in a running suture of 0 PDS.  Deep subcu closed with interrupted sutures of 0 plain catgut.  Skin was closed with staples.  The sponge, instrument, and needle count was correct by circulating nurse.  Urine output remained clear at the time of closure.  Patient tolerated the procedure well and was returned to recovery room in good condition once extubated.     Juluis Mire, M.D.     JSM/MEDQ  D:  01/08/2015  T:  01/09/2015  Job:  [id number]

## 2015-01-09 NOTE — Progress Notes (Signed)
   01/09/15 1700  Clinical Encounter Type  Visited With Patient and family together (mom and dad)  Visit Type Follow-up;Spiritual support;Social support  Spiritual Encounters  Spiritual Needs Grief support;Emotional;Brochure;Prayer Librarian, academic(Comfort Packet, prayer shawl)   Followed up with [name] [name] and her parents, talking them through possibilities related to arrangements, funeral/memorial service, and other ways of memorializing a baby.  Family very appreciative and asked questions.  Provided space for [name] [name] to share her story and begin to process her grief.  She named her feelings readily, noting numbness, anger, sadness, and others.  Normalized feelings, providing spiritual and emotional support.  Reviewed Comfort Packet, sharing in detail about Heartstrings, which is of interest to mom and pt.  Included "Peace and Remembrance" booklet for families whose baby has died in a NICU.  Provided prayer shawl as tangible sign of comfort and care, and offered prayer per request.  Plan to f/u tomorrow, but please also page as needs arise:  [phone].  Thank you.  [address]Chaplain Claudio Mondry [place]unde[place], [place] [phone]

## 2015-01-09 NOTE — Progress Notes (Signed)
Paged at 5:01am, Chaplain arrived in the NICU from which the original page came, and arrived shortly after the baby had died.  Was consequently directed to the room.  The patient was in her bed, tired and sad, with her mom present.  We talked about the depth of the loss, especially since the patient's age and cervix issues would likely preclude another pregnancy (thisis how she explained it).  She had just experienced her "last chance" slip away.  She was processing the loss of dreams and shattered hope.    We also dealt with the issue of guilt, the challenges in returning to work in a [employer] center (her boss is supportive of the time she may need) and being around young kids, who apparently really love her.  We looked at some of the issues and scenarios of other secondary grief stimuli.  Spent some time with the patient's mother as she was processing the loss of her grand daughter and her daughter's pain.  Spent about an hour with the patient.  Their were some smiles as she related stories of "her kids" (from the center, and what they might say to her, and how they might say it).  We also discussed issues of how God works, and that God was grieving also.  God does not inflict pain for our growth, but rather is there for peace and comfort as pain comes into our lives from the situations and societies we live in.  We also took some time to acknowledge the great questions of "Why?" and "Why me?"  Let her know that the day Chaplain would likely follow-up, and she was fine with that.  Rema Jasmineichard Delainy Mcelhiney, Chaplain Pager: [phone]

## 2015-01-09 NOTE — Progress Notes (Signed)
Baby expired this am Good pain relief  VSS Afeb UO about 225 cc overnight, concentrated  Lungs CTA Cor RRR Abd soft, BS good, dressing C&D Ext PAS on  Results for orders placed or performed during the hospital encounter of 01/08/15 (from the past 24 hour(s))  Urinalysis, Routine w reflex microscopic     Status: Abnormal   Collection Time: 01/08/15  9:00 AM  Result Value Ref Range   Color, Urine YELLOW YELLOW   APPearance CLEAR CLEAR   Specific Gravity, Urine >1.030 (H) 1.005 - 1.030   pH 5.5 5.0 - 8.0   Glucose, UA NEGATIVE NEGATIVE mg/dL   Hgb urine dipstick TRACE (A) NEGATIVE   Bilirubin Urine NEGATIVE NEGATIVE   Ketones, ur NEGATIVE NEGATIVE mg/dL   Protein, ur NEGATIVE NEGATIVE mg/dL   Urobilinogen, UA 0.2 0.0 - 1.0 mg/dL   Nitrite NEGATIVE NEGATIVE   Leukocytes, UA MODERATE (A) NEGATIVE  Urine microscopic-add on     Status: Abnormal   Collection Time: 01/08/15  9:00 AM  Result Value Ref Range   Squamous Epithelial / LPF FEW (A) RARE   WBC, UA 21-50 <3 WBC/hpf   RBC / HPF 0-2 <3 RBC/hpf   Bacteria, UA RARE RARE   Urine-Other MUCOUS PRESENT   Wet prep, genital     Status: Abnormal   Collection Time: 01/08/15 10:10 AM  Result Value Ref Range   Yeast Wet Prep HPF POC NONE SEEN NONE SEEN   Trich, Wet Prep NONE SEEN NONE SEEN   Clue Cells Wet Prep HPF POC NONE SEEN NONE SEEN   WBC, Wet Prep HPF POC MODERATE (A) NONE SEEN  Amnisure rupture of membrane (rom)     Status: None   Collection Time: 01/08/15  1:20 PM  Result Value Ref Range   Amnisure ROM POSITIVE   CBC on admission     Status: Abnormal   Collection Time: 01/08/15  3:25 PM  Result Value Ref Range   WBC 20.1 (H) 4.0 - 10.5 K/uL   RBC 3.40 (L) 3.87 - 5.11 MIL/uL   Hemoglobin 10.0 (L) 12.0 - 15.0 g/dL   HCT 16.129.4 (L) 09.636.0 - 04.546.0 %   MCV 86.5 78.0 - 100.0 fL   MCH 29.4 26.0 - 34.0 pg   MCHC 34.0 30.0 - 36.0 g/dL   RDW 40.914.1 81.111.5 - 91.415.5 %   Platelets 249 150 - 400 K/uL  Type and screen     Status: None   Collection Time: 01/09/15 12:05 AM  Result Value Ref Range   ABO/RH(D) O POS    Antibody Screen NEG    Sample Expiration 01/12/2015   ABO/Rh     Status: None   Collection Time: 01/09/15 12:05 AM  Result Value Ref Range   ABO/RH(D) O POS   Differential     Status: Abnormal   Collection Time: 01/09/15  8:00 AM  Result Value Ref Range   Neutrophils Relative % 81 (H) 43 - 77 %   Neutro Abs 14.2 (H) 1.7 - 7.7 K/uL   Lymphocytes Relative 13 12 - 46 %   Lymphs Abs 2.3 0.7 - 4.0 K/uL   Monocytes Relative 6 3 - 12 %   Monocytes Absolute 1.1 (H) 0.1 - 1.0 K/uL   Eosinophils Relative 0 0 - 5 %   Eosinophils Absolute 0.0 0.0 - 0.7 K/uL   Basophils Relative 0 0 - 1 %   Basophils Absolute 0.0 0.0 - 0.1 K/uL  CBC     Status: Abnormal  Collection Time: 01/09/15  8:00 AM  Result Value Ref Range   WBC 17.6 (H) 4.0 - 10.5 K/uL   RBC 2.20 (L) 3.87 - 5.11 MIL/uL   Hemoglobin 6.7 (LL) 12.0 - 15.0 g/dL   HCT 16.1 (L) 09.6 - 04.5 %   MCV 87.3 78.0 - 100.0 fL   MCH 30.5 26.0 - 34.0 pg   MCHC 34.9 30.0 - 36.0 g/dL   RDW 40.9 81.1 - 91.4 %   Platelets 216 150 - 400 K/uL    A/P fluid bolus       CBC at noon

## 2015-01-09 NOTE — Progress Notes (Signed)
   01/09/15 1000  Clinical Encounter Type  Visited With Patient not available;Family (mom)  Visit Type Spiritual support;Social support  Referral From [facility]([place] consult)  Spiritual Encounters  Spiritual Needs Grief support;Emotional (questions about burial/cremation, funeral/memorial)  Stress Factors  Patient Stress Factors Loss (loss, plus maternal age (complicated grief))  Family Stress Factors Loss   While Alease was sleeping, visited with her mother to offer spiritual/emotional support.  Assisted mom with questions about funeral home and service logistics to aid family's discernment.  Pt's dad plans to visit after work, at which time family plans to discuss arrangements in more detail.  Provided pastoral presence, reflective listening, naming of layers of grief, and normalization of feelings.  Mom is aware of ongoing chaplain availability for support and help with questions; please page as needs arise.  Thank you.  [address]Chaplain Elverta Dimiceli MillingtonLunde[place], [place] [phone]

## 2015-01-10 ENCOUNTER — Encounter (HOSPITAL_COMMUNITY): Payer: Self-pay | Admitting: Obstetrics and Gynecology

## 2015-01-10 LAB — CBC
HEMATOCRIT: 14.6 % — AB (ref 36.0–46.0)
Hemoglobin: 5.1 g/dL — CL (ref 12.0–15.0)
MCH: 30.2 pg (ref 26.0–34.0)
MCHC: 34.9 g/dL (ref 30.0–36.0)
MCV: 86.4 fL (ref 78.0–100.0)
PLATELETS: 170 10*3/uL (ref 150–400)
RBC: 1.69 MIL/uL — ABNORMAL LOW (ref 3.87–5.11)
RDW: 14.4 % (ref 11.5–15.5)
WBC: 13.9 10*3/uL — ABNORMAL HIGH (ref 4.0–10.5)

## 2015-01-10 LAB — PREPARE RBC (CROSSMATCH)

## 2015-01-10 LAB — RPR: RPR Ser Ql: NONREACTIVE

## 2015-01-10 MED ORDER — DIPHENHYDRAMINE HCL 25 MG PO CAPS
25.0000 mg | ORAL_CAPSULE | Freq: Once | ORAL | Status: AC
  Administered 2015-01-10: 25 mg via ORAL
  Filled 2015-01-10: qty 1

## 2015-01-10 MED ORDER — ACETAMINOPHEN 325 MG PO TABS
650.0000 mg | ORAL_TABLET | Freq: Once | ORAL | Status: AC
  Administered 2015-01-10: 650 mg via ORAL
  Filled 2015-01-10: qty 2

## 2015-01-10 MED ORDER — FERROUS SULFATE 325 (65 FE) MG PO TABS
325.0000 mg | ORAL_TABLET | Freq: Three times a day (TID) | ORAL | Status: DC
  Administered 2015-01-10 – 2015-01-12 (×6): 325 mg via ORAL
  Filled 2015-01-10 (×6): qty 1

## 2015-01-10 MED ORDER — ZOLPIDEM TARTRATE 5 MG PO TABS
5.0000 mg | ORAL_TABLET | Freq: Every evening | ORAL | Status: DC | PRN
  Administered 2015-01-10: 5 mg via ORAL
  Filled 2015-01-10: qty 1

## 2015-01-10 MED ORDER — SODIUM CHLORIDE 0.9 % IV SOLN
Freq: Once | INTRAVENOUS | Status: AC
  Administered 2015-01-10: 12:00:00 via INTRAVENOUS

## 2015-01-10 NOTE — Progress Notes (Signed)
I offered pastoral presence as Antionetta grieved the loss of her baby as well as the loss of hopes and dreams. She processed her way of coping with loss and how this was going to be so very different.  She processed the loss of her relationship with FOB and the many emotions she had throughout the pregnancy going through it alone.  She feels very much alone and stated that she just felt that life was not "worthwhile" anymore.  She explored ways to remember and honor her baby and how to continue to live through the loneliness.  Our visit was interrupted, but I have passed this information on to [facility] Lisa Lundeen for follow up this afternoon.  [address]Chaplain Katy Carolinelaussen Pager, [phone] 12:56 PM    01/10/15 1200  Clinical Encounter Type  Visited With Patient  Visit Type Spiritual support  Referral From Nurse;Chaplain  Spiritual Encounters  Spiritual Needs Emotional;Grief support  Stress Factors  Patient Stress Factors Loss;Major life changes

## 2015-01-10 NOTE — Progress Notes (Signed)
   01/10/15 1600  Clinical Encounter Type  Visited With Patient;Health care provider Delorse Lek(Chris Galloway, RN)  Visit Type Follow-up;Spiritual support;Social support  Referral From Chaplain  Spiritual Encounters  Spiritual Needs Grief support;Emotional   Followed up after consultation with Chaplain Dyanne CarrelKaty Claussen, offering further bereavement support.  Visited with Shaira for >90 minutes, providing pastoral presence and reflective listening as she worked through naming her grief/expressing her sorrow and into remembering sources of meaning and purpose in her life.  Assisted her in thinking about a self-care plan for the emotionally raw times when she is home alone.  We talked in detail about grief counseling and venues for finding such support.  Sarayah appears open to counseling and interested in potential benefits to her; she plans to seek out someone next week so that she can get an appointment in place.  Please page 24/7 if further needs arise.  Thank you.  [address]Chaplain Asbury Hair SummersideLundeen, [place] [phone]

## 2015-01-10 NOTE — Progress Notes (Signed)
Subjective: Postpartum Day 2: Cesarean Delivery Patient reports tolerating PO, + flatus and no problems voiding.  Patient denies SOB or dizziness.  Objective: Vital signs in last 24 hours: Temp:  [98.1 F (36.7 C)-99 F (37.2 C)] 98.1 F (36.7 C) (02/05 0559) Pulse Rate:  [93-106] 105 (02/05 0559) Resp:  [20] 20 (02/05 0559) BP: (91-105)/(46-50) 105/47 mmHg (02/05 0559) SpO2:  [98 %-100 %] 100 % (02/05 0559)  Physical Exam:  General: alert and cooperative Lochia: appropriate Uterine Fundus: firm Incision: healing well DVT Evaluation: No evidence of DVT seen on physical exam. Negative Homan's sign. No cords or calf tenderness. Calf/Ankle edema is present.   Recent Labs  01/09/15 1150 01/10/15 0535  HGB 5.9* 5.1*  HCT 17.8* 14.6*    Assessment/Plan: Status post Cesarean section. Postoperative course complicated by anemia and fetal loss  Feso4 tid recheck CBC at 12.  CURTIS,CAROL G 01/10/2015, 8:53 AM  Patient feeling fatigue and would like transfusion.  Counseled re: risk of viral transmission and transfusion rxn.  Patient accepts and questions were answered.  Will transfuse 2 u PRBCs.    Mitchel HonourMegan Lilburn Straw, DO

## 2015-01-10 NOTE — Progress Notes (Signed)
CRITICAL VALUE ALERT  Critical value received:  Hgb 5.1  Date of notification:  01/10/15  Time of notification: 0558  Critical value read back:Yes.    Nurse who received alert:  Alroy BailiffJ. Sheikh Leverich, RN  MD notified (1st page):  Dr. Henderson Cloudomblin  Time of first page:  0605  No new orders. Dr. Langston MaskerMorris to see pt this morning.

## 2015-01-10 NOTE — Progress Notes (Signed)
CRITICAL VALUE ALERT  Critical value received:Hgb 6.5  Date of notification:  01/10/15  Time of notification: 2115  Critical value read back:Yes.    Nurse who received alert:  Alroy BailiffJ. Kenora Spayd, RN  MD notified (1st page): Dr. Langston MaskerMorris  Time of first page:  2120  No new orders obtained

## 2015-01-11 LAB — CBC
HCT: 18.4 % — ABNORMAL LOW (ref 36.0–46.0)
HCT: 17.7 % — ABNORMAL LOW (ref 36.0–46.0)
HEMOGLOBIN: 6.5 g/dL — AB (ref 12.0–15.0)
HEMOGLOBIN: 6.1 g/dL — AB (ref 12.0–15.0)
MCH: 30.1 pg (ref 26.0–34.0)
MCH: 29.3 pg (ref 26.0–34.0)
MCHC: 35.3 g/dL (ref 30.0–36.0)
MCHC: 34.5 g/dL (ref 30.0–36.0)
MCV: 85.2 fL (ref 78.0–100.0)
MCV: 85.1 fL (ref 78.0–100.0)
Platelets: 178 10*3/uL (ref 150–400)
Platelets: 175 10*3/uL (ref 150–400)
RBC: 2.16 MIL/uL — ABNORMAL LOW (ref 3.87–5.11)
RBC: 2.08 MIL/uL — ABNORMAL LOW (ref 3.87–5.11)
RDW: 14.5 % (ref 11.5–15.5)
RDW: 14.7 % (ref 11.5–15.5)
WBC: 13.5 10*3/uL — ABNORMAL HIGH (ref 4.0–10.5)
WBC: 11.4 10*3/uL — AB (ref 4.0–10.5)

## 2015-01-11 LAB — TYPE AND SCREEN
ABO/RH(D): O POS
Antibody Screen: NEGATIVE
UNIT DIVISION: 0
UNIT DIVISION: 0

## 2015-01-11 LAB — COMPREHENSIVE METABOLIC PANEL
ALT: 15 U/L (ref 0–35)
AST: 21 U/L (ref 0–37)
Albumin: 2.6 g/dL — ABNORMAL LOW (ref 3.5–5.2)
Alkaline Phosphatase: 68 U/L (ref 39–117)
Anion gap: 3 — ABNORMAL LOW (ref 5–15)
BUN: 17 mg/dL (ref 6–23)
CO2: 23 mmol/L (ref 19–32)
CREATININE: 0.9 mg/dL (ref 0.50–1.10)
Calcium: 8.6 mg/dL (ref 8.4–10.5)
Chloride: 112 mmol/L (ref 96–112)
GFR calc Af Amer: >90 mL/min (ref >90)
GFR, EST NON AFRICAN AMERICAN: 78 mL/min — AB (ref >90)
GLUCOSE: 84 mg/dL (ref 70–99)
POTASSIUM: 4.1 mmol/L (ref 3.5–5.1)
Sodium: 138 mmol/L (ref 135–145)
Total Bilirubin: 0.6 mg/dL (ref 0.3–1.2)
Total Protein: 6 g/dL (ref 6.0–8.3)

## 2015-01-11 NOTE — Progress Notes (Signed)
CRITICAL VALUE ALERT  Critical value received:  Hgb 6.1  Date of notification: 01/11/15  Time of notification: 0530  Critical value read back:Yes.    Nurse who received alert:  Alroy BailiffJ. Dezaria Methot, RN  MD notified (1st page):  Dr. Langston MaskerMorris  Time of first page:  0530  No new orders obtained.

## 2015-01-11 NOTE — Consult Note (Addendum)
Mom given anticipatory guidance in case her milk comes in (permission to enter room obtained by RN).  The following were discussed and written down: 1. Sage tea 2. Cabbage leaves in bra (crush veins, change out leaves as they wilt).  Do around the clock as tolerated. 3. If breasts become overly full and very uncomfortable, she may express some to relieve discomfort.   4. If she gets fever, chills, or streaks on her breasts, call MD, CNM.  Glenetta HewKim Miroslav Gin, RN, IBCLC

## 2015-01-11 NOTE — Progress Notes (Signed)
Subjective: Postpartum Day 3: Cesarean Delivery Patient reports tolerating PO and no problems voiding.  Denies HA, dizziness, CP/SOB, fatigue.  Excellent pain control.    Objective: Vital signs in last 24 hours: Temp:  [98.1 F (36.7 C)-99.3 F (37.4 C)] 98.1 F (36.7 C) (02/06 0531) Pulse Rate:  [97-121] 101 (02/06 0531) Resp:  [18-24] 18 (02/05 2116) BP: (99-121)/(45-65) 100/51 mmHg (02/06 0531) SpO2:  [99 %-100 %] 99 % (02/06 0531)  Physical Exam:  General: alert, cooperative and appears stated age Lochia: appropriate Uterine Fundus: firm Incision: healing well, no significant drainage, no dehiscence DVT Evaluation: No evidence of DVT seen on physical exam. Negative Homan's sign. No cords or calf tenderness.   Recent Labs  01/10/15 2100 01/11/15 0513  HGB 6.5* 6.1*  HCT 18.4* 17.7*    Assessment/Plan: Status post Cesarean section POD#3; neonatal demise at 23 weeks  Doing well postoperatively.  Acute blood loss on chronic anemia-s/p 2u PRBCs yesterday with less than expected rise in Hgb.  Patient declines repeat transfusion.  Will continue ferrous sulfate.  Patient given pain and bleeding precautions.  Will keep in house to repeat hgb tomorrow and continue to monitor. Continue current postpartum care.  Sandria Mcenroe 01/11/2015, 10:43 AM

## 2015-01-12 LAB — CBC
HCT: 19 % — ABNORMAL LOW (ref 36.0–46.0)
Hemoglobin: 6.5 g/dL — CL (ref 12.0–15.0)
MCH: 29.5 pg (ref 26.0–34.0)
MCHC: 34.2 g/dL (ref 30.0–36.0)
MCV: 86.4 fL (ref 78.0–100.0)
PLATELETS: 217 10*3/uL (ref 150–400)
RBC: 2.2 MIL/uL — AB (ref 3.87–5.11)
RDW: 14.4 % (ref 11.5–15.5)
WBC: 9.8 10*3/uL (ref 4.0–10.5)

## 2015-01-12 MED ORDER — HYDROMORPHONE HCL 2 MG PO TABS: 2.0000 mg | ORAL_TABLET | ORAL | Status: AC | PRN

## 2015-01-12 MED ORDER — ZOLPIDEM TARTRATE 5 MG PO TABS: 5.0000 mg | ORAL_TABLET | Freq: Every evening | ORAL | Status: AC | PRN

## 2015-01-12 MED ORDER — IBUPROFEN 600 MG PO TABS
600.0000 mg | ORAL_TABLET | Freq: Four times a day (QID) | ORAL | Status: AC
Start: 2015-01-12 — End: ?

## 2015-01-12 NOTE — Progress Notes (Addendum)
Subjective: Postpartum Day 4: Cesarean Delivery Patient reports tolerating PO, + flatus and no problems voiding.  Excellent pain control.  Cerclage still in.  Objective: Vital signs in last 24 hours: Temp:  [98 F (36.7 C)-98.9 F (37.2 C)] 98 F (36.7 C) (02/07 0618) Pulse Rate:  [86-97] 86 (02/07 0618) Resp:  [18-20] 18 (02/07 0618) BP: (116-141)/(59-68) 116/59 mmHg (02/07 0618) SpO2:  [97 %-98 %] 98 % (02/07 0618)  Physical Exam:  General: alert, cooperative and appears stated age Lochia: appropriate Uterine Fundus: firm Incision: healing well, no significant drainage, no dehiscence DVT Evaluation: No evidence of DVT seen on physical exam. Negative Homan's sign. No cords or calf tenderness.   Recent Labs  01/11/15 0513 01/12/15 0638  HGB 6.1* 6.5*  HCT 17.7* 19.0*    Assessment/Plan: Status post Cesarean section at 23 weeks, neonatal demise  Doing well postoperatively.  Discharge home with standard precautions and return to clinic in 1 week Acute blood loss on chronic anemia-continue ferrous sulfate Cerclage-patient prefers to have stitch removed in the office.  Will plan to do at incision check.  Farran Amsden 01/12/2015, 9:59 AM

## 2015-01-12 NOTE — Progress Notes (Signed)
Discharge instructions reviewed with patient.  Patient states understanding of home care, medications, activity, signs/symptoms to report to MD and return MD office visit.  Patients family will assist with her care @ home.  No home equipment needed, prescriptions given and patient has all personal belongings..  Patient ambulated for discharge in stable condition with staff without incident.

## 2015-01-12 NOTE — Discharge Summary (Signed)
Obstetric Discharge Summary Reason for Admission: rupture of membranes Prenatal Procedures: cerclage Intrapartum Procedures: cesarean: low cervical, vertical Postpartum Procedures: transfusion 2 u PRBCs Complications-Operative and Postpartum: none HEMOGLOBIN  Date Value Ref Range Status  01/12/2015 6.5* 12.0 - 15.0 g/dL Final    Comment:    REPEATED TO VERIFY CRITICAL RESULT CALLED TO, READ BACK BY AND VERIFIED WITH: MAGGIE LATHAM RN.@0655  ON 2.7.16 BY TCALDWELL    HCT  Date Value Ref Range Status  01/12/2015 19.0* 36.0 - 46.0 % Final    Physical Exam:  General: alert, cooperative and appears stated age 76Lochia: appropriate Uterine Fundus: firm Incision: healing well, no significant drainage DVT Evaluation: No evidence of DVT seen on physical exam. Negative Homan's sign. No cords or calf tenderness.  Discharge Diagnoses: Antepartum bleeding, Incompetent cervix and PPROM  Discharge Information: Date: 01/12/2015 Activity: pelvic rest Diet: routine Medications: PNV, Ibuprofen, Iron and Dilaudid, Ambien Condition: stable Instructions: refer to practice specific booklet Discharge to: home   Newborn Data: Live born female  Birth Weight: 1 lb 1.6 oz (499 g) APGAR: 1, 4  Home with Deceased.  Lanise Mergen 01/12/2015, 10:07 AM

## 2015-01-12 NOTE — Discharge Instructions (Signed)
Call MD for T>100.4, heavy vaginal bleeding, severe abdominal pain, intractable nausea and/or vomiting, or respiratory distress.  Call office to schedule incision check/staple removal on Wednesday with Dr. Langston MaskerMorris.  Will need hemoglobin check and cerclage removal at that time.  No driving while taking narcotics.  Pelvic rest x 6 weeks.

## 2015-01-13 NOTE — Anesthesia Postprocedure Evaluation (Signed)
  Anesthesia Post-op Note  Patient: [name] [name]  Procedure(s) Performed: Procedure(s) (LRB): CESAREAN SECTION (N/A)  Patient Location: PACU  Anesthesia Type: Spinal  Level of Consciousness: awake and alert   Airway and Oxygen Therapy: Patient Spontanous Breathing  Post-op Pain: mild  Post-op Assessment: Post-op Vital signs reviewed, Patient's Cardiovascular Status Stable, Respiratory Function Stable, Patent Airway and No signs of Nausea or vomiting  Last Vitals:  Filed Vitals:   01/12/15 1145  BP: 119/58  Pulse: 88  Temp: 37.2 C  Resp: 18    Post-op Vital Signs: stable   Complications: No apparent anesthesia complications

## 2015-01-15 ENCOUNTER — Ambulatory Visit (HOSPITAL_COMMUNITY): Payer: BLUE CROSS/BLUE SHIELD

## 2015-01-22 ENCOUNTER — Ambulatory Visit (HOSPITAL_COMMUNITY): Payer: BLUE CROSS/BLUE SHIELD

## 2016-01-26 IMAGING — US US OB DETAIL+14 WK
1 series · 12 of 28 positions shown · non-contrast
Comparison: none

[Series 1: us ob detail+14 wk · 0.27mm/px · 12 of 57 slices shown]
[im 3/57]
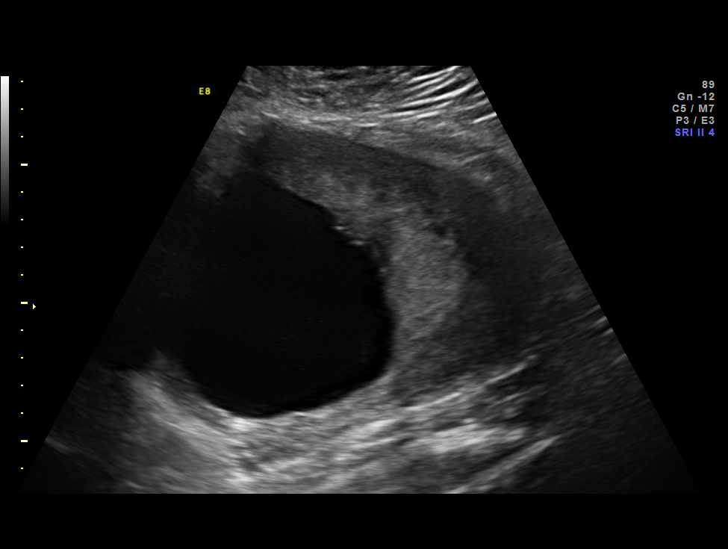
[im 7/57]
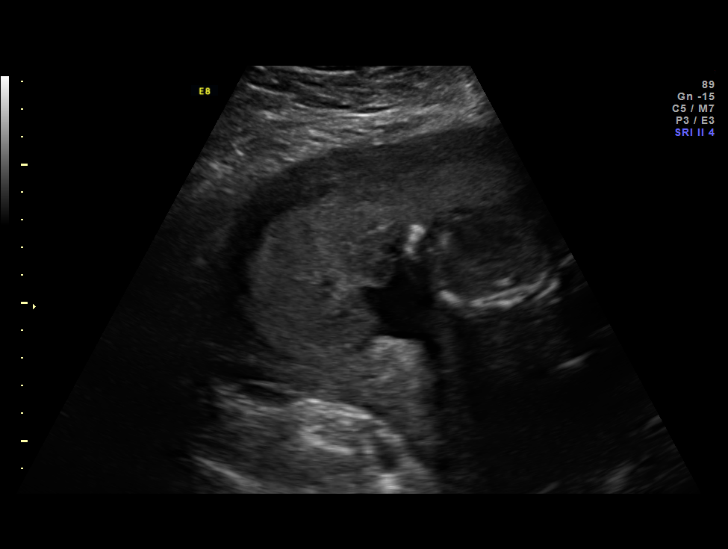
[im 11/57]
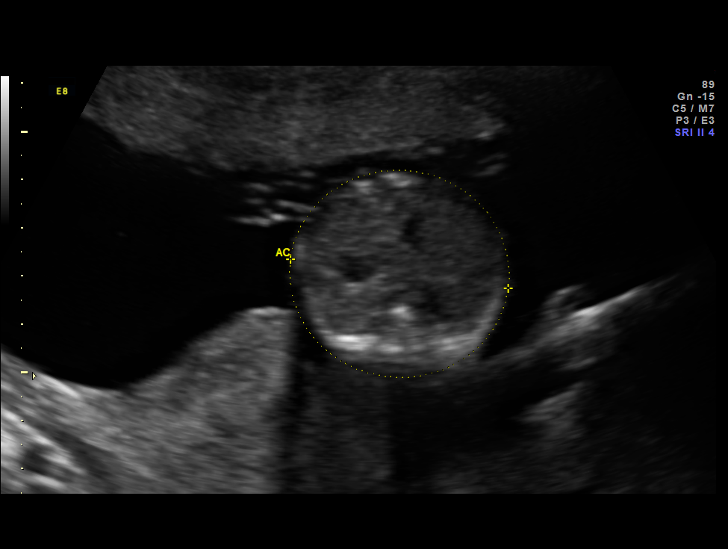
[im 17/57]
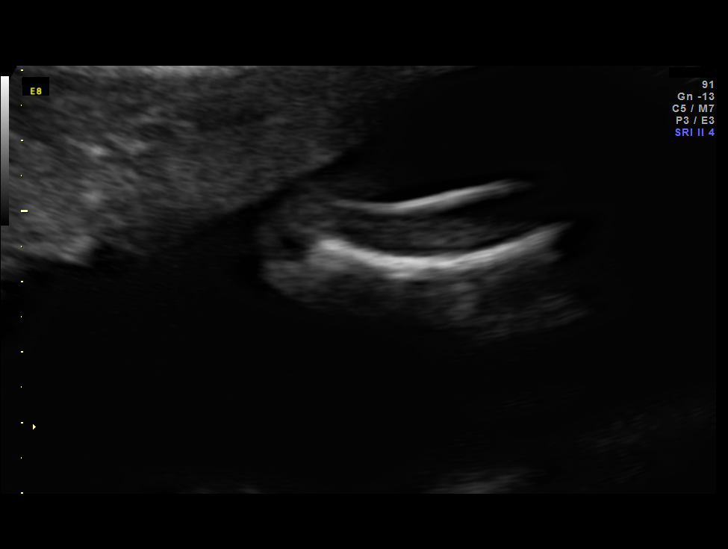
[im 21/57]
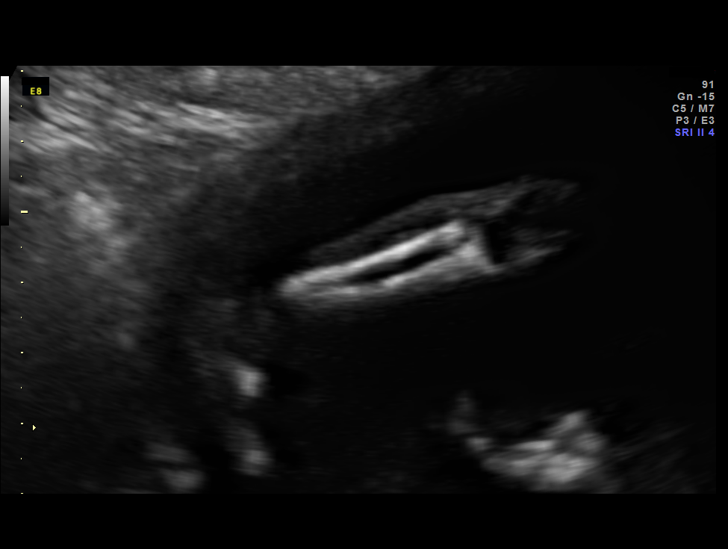
[im 25/57]
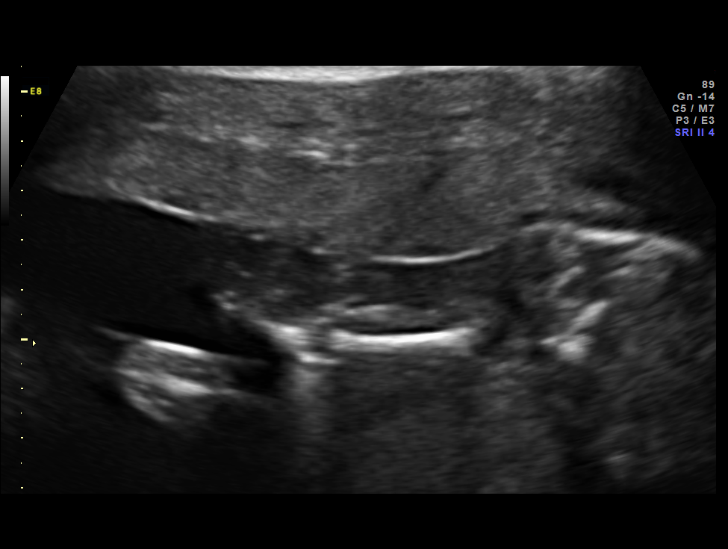
[im 32/57]
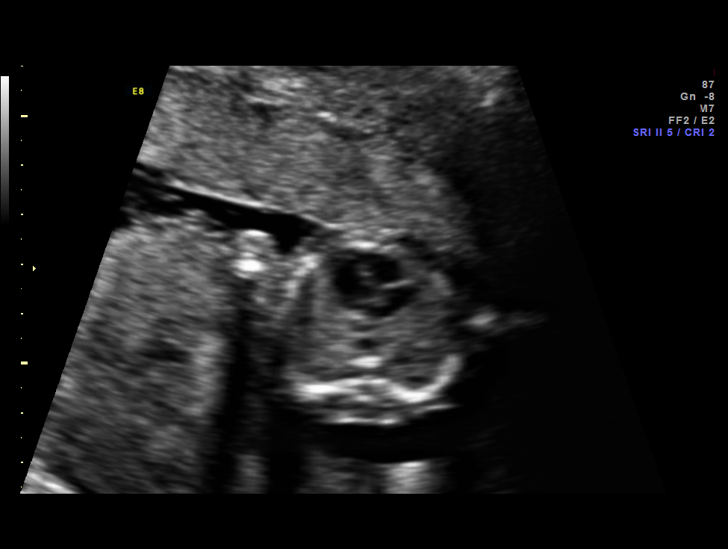
[im 36/57]
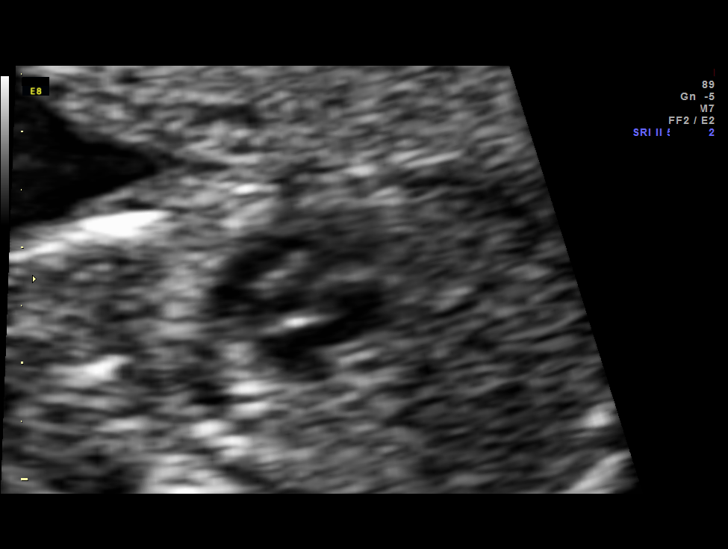
[im 40/57]
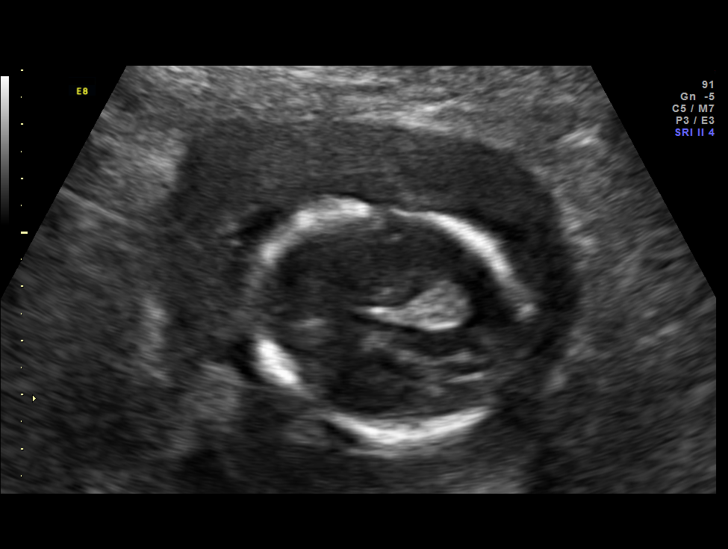
[im 46/57]
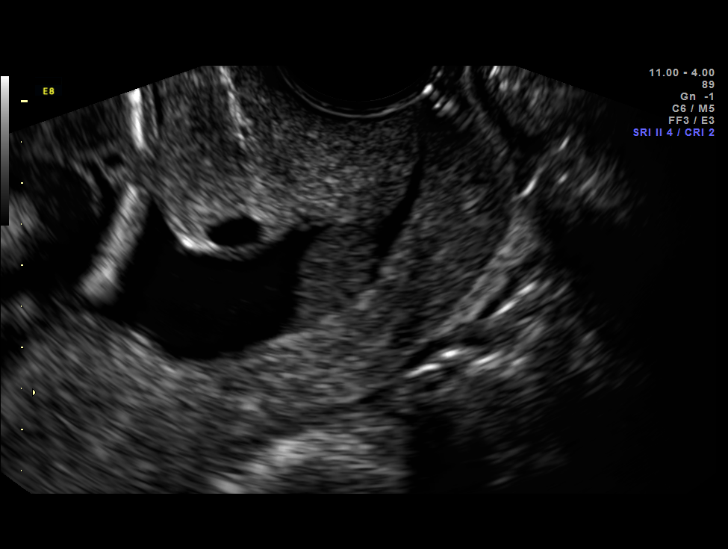
[im 50/57]
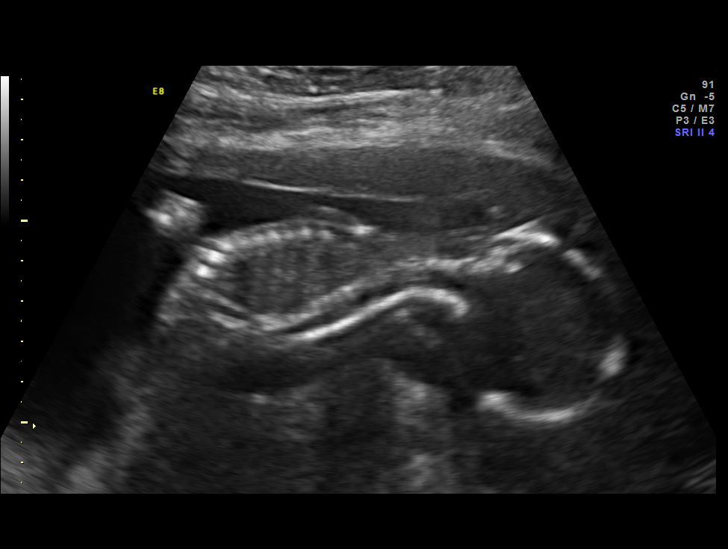
[im 54/57]
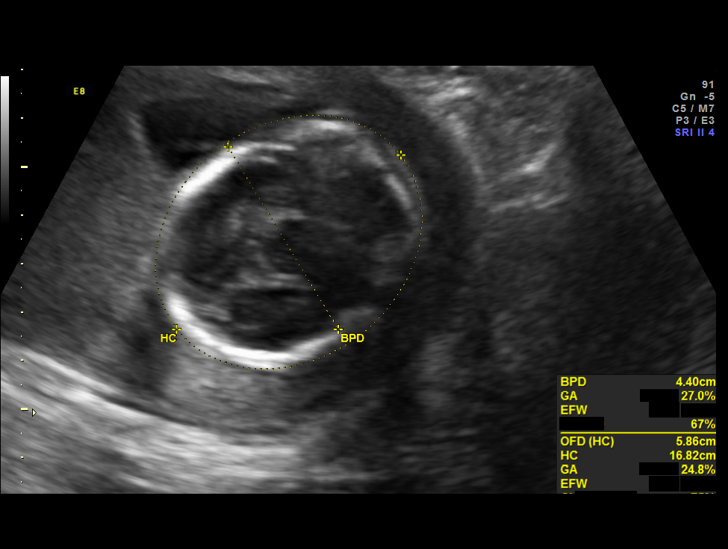

[12 of 28 positions shown; findings below may reference images not displayed]

OBSTETRICS REPORT
                      (Signed Final 12/16/2014 [DATE])

Service(s) Provided

 US OB DETAIL + 14 WK                                  76811.0
 US MFM OB TRANSVAGINAL                                76817.2
Indications

 Detailed fetal anatomic survey                        Z36
 Advanced maternal age multigravida 35+, second
 trimester
 Maternal morbid obesity
 Low risk NIPS                                         R52
Fetal Evaluation

 Num Of Fetuses:    1
 Fetal Heart Rate:  159                          bpm
 Cardiac Activity:  Observed
 Presentation:      Cephalic
 Placenta:          Posterior Right, above
                    cervical os
 P. Cord            Visualized
 Insertion:

 Amniotic Fluid
 AFI FV:      Subjectively within normal limits
                                             Larg Pckt:     8.2  cm
Biometry

 BPD:     42.8  mm     G. Age:  19w 0d                CI:         72.2   70 - 86
 OFD:     59.3  mm                                    FL/HC:      19.1   16.8 -

 HC:       167  mm     G. Age:  19w 3d       22  %    HC/AC:      1.20   1.09 -

 AC:     139.5  mm     G. Age:  19w 2d       28  %    FL/BPD:
 FL:      31.9  mm     G. Age:  20w 0d       45  %    FL/AC:      22.9   20 - 24
 HUM:     30.7  mm     G. Age:  20w 1d       61  %

 Est. FW:     300  gm    0 lb 11 oz      43  %
Gestational Age

 LMP:           13w 0d        Date:  09/16/14                 EDD:   06/23/15
 U/S Today:     19w 3d                                        EDD:   05/09/15
 Best:          19w 6d     Det. By:  Early Ultrasound         EDD:   05/06/15
                                     (11/05/14)
Anatomy

 Cranium:          Appears normal         Ductal Arch:      Appears normal
 Fetal Cavum:      Not well visualized    Diaphragm:        Appears normal
 Ventricles:       Not well visualized    Stomach:          Appears normal, left
                                                            sided
 Choroid Plexus:   Appears normal         Abdomen:          Appears normal
 Cerebellum:       Not well visualized    Abdominal Wall:   Appears nml (cord
                                                            insert, abd wall)
 Posterior Fossa:  Not well visualized    Cord Vessels:     Appears normal (3
                                                            vessel cord)
 Face:             Orbits appear          Kidneys:          Not well visualized
                   normal
 Lips:             Not well visualized    Bladder:          Appears normal
 Heart:            Appears normal         Spine:            Not well visualized
                   (4CH, axis, and
                   situs)
 RVOT:             Appears normal         Lower             Appears normal
                                          Extremities:
 LVOT:             Appears normal         Upper             Appears normal
                                          Extremities:
 Aortic Arch:      Not well visualized

 Other:  Fetus appears to be a female. Heels appears normal.
Cervix Uterus Adnexa

 Cervical Length:    0        cm

 Cervix:       Measured transvaginally.
Impression

 Single IUP at 19w 6d
 Advanced maternal age (43 yo)- NIPS low risk for aneuploidy,
 obesity
 Limited views of the cranial anatomy and spine obtained
 Posterior placenta without previa

 TVUS: V-shaped funneling that extends to the external os.
 No measurable cervix.  Some cervical debris is noted

 Ultrasound findings discussed with the patient.  Reviewed
 management options - would offer ultrasound-indicated
 cerclage if there is no evidence of infection or membranes
 into the vagina.
Recommendations

 Recommendations discussed with Dr. Bran.
 Patient was taken to the [facility] for further evaluation and to
 further consider her options.

 Recommend follow up ultrasound in 2 weeks for cervical
 length and to complete anatomy.

 questions or concerns.

## 2016-02-17 IMAGING — US US MFM OB TRANSVAGINAL
1 series · 13 of 17 positions shown · non-contrast
Comparison: none

[Series 1: cl · 0.12mm/px · 17 acquisitions, 13 frames shown]
[im 1/17]
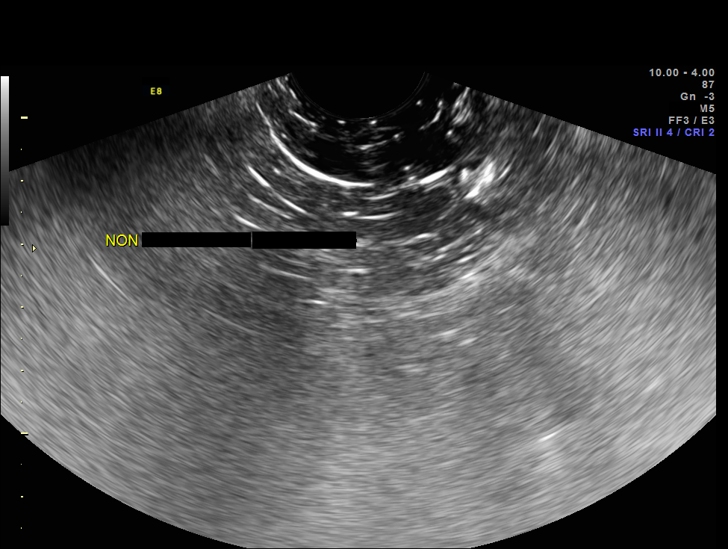
[im 2/17]
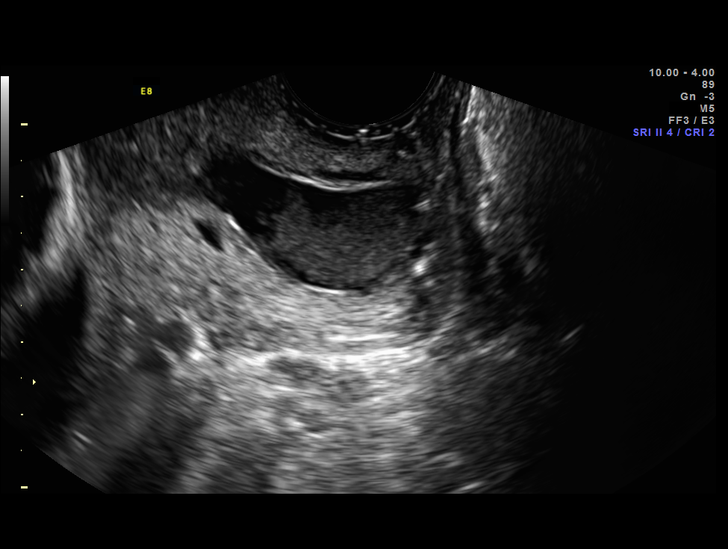
[im 4/17]
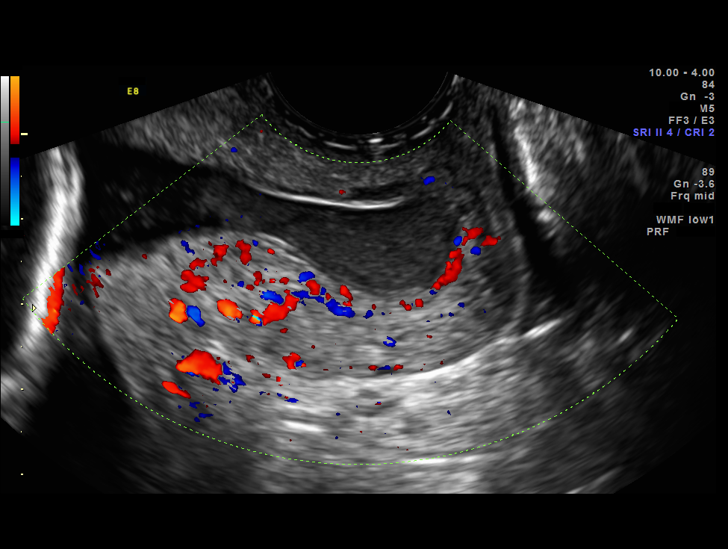
[im 5/17]
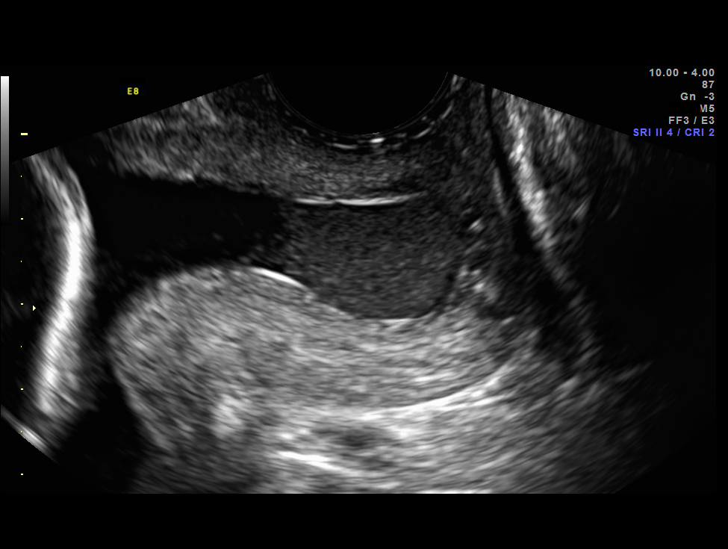
[im 6/17]
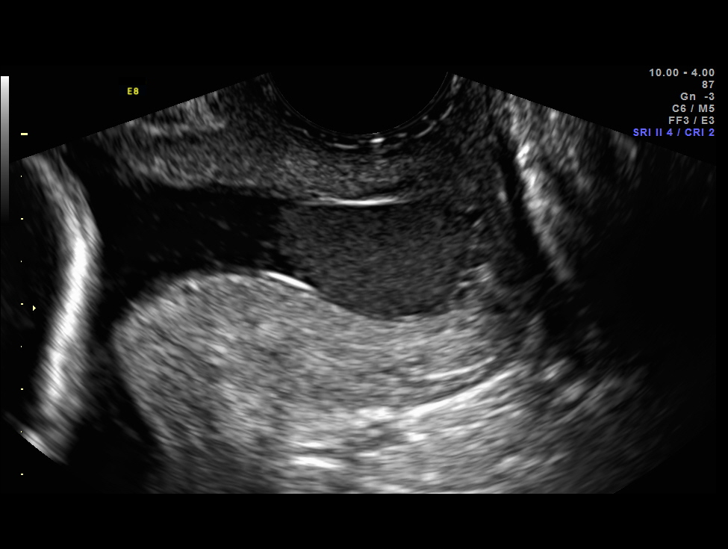
[im 8/17]
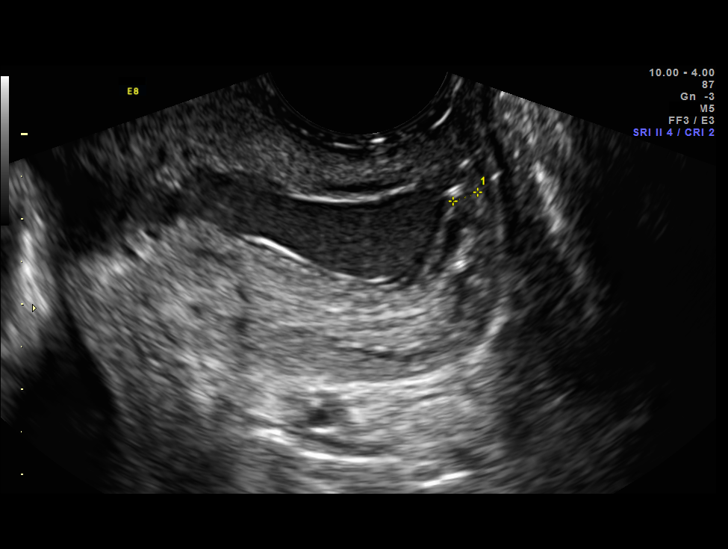
[im 9/17]
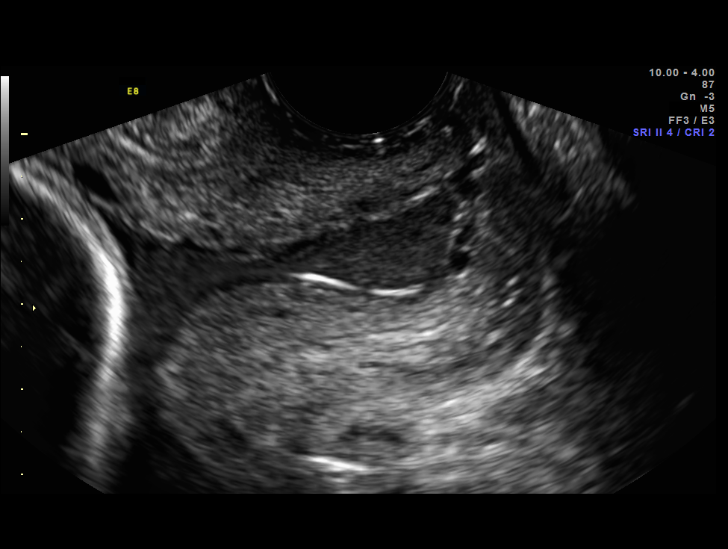
[im 10/17]
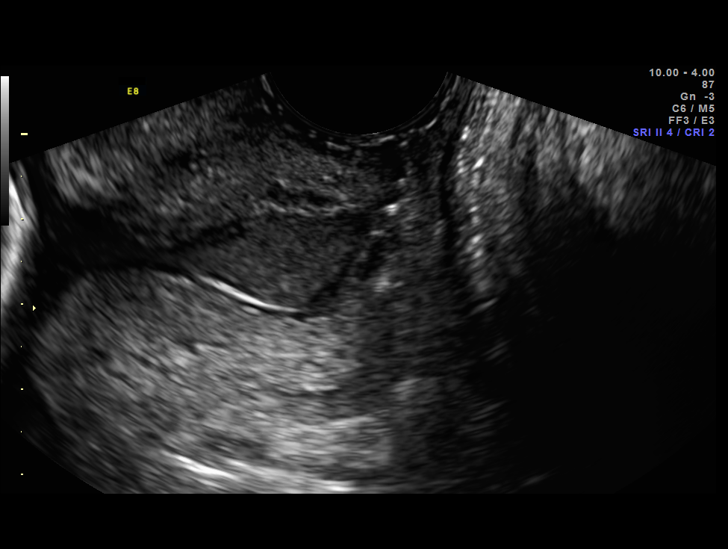
[im 12/17]
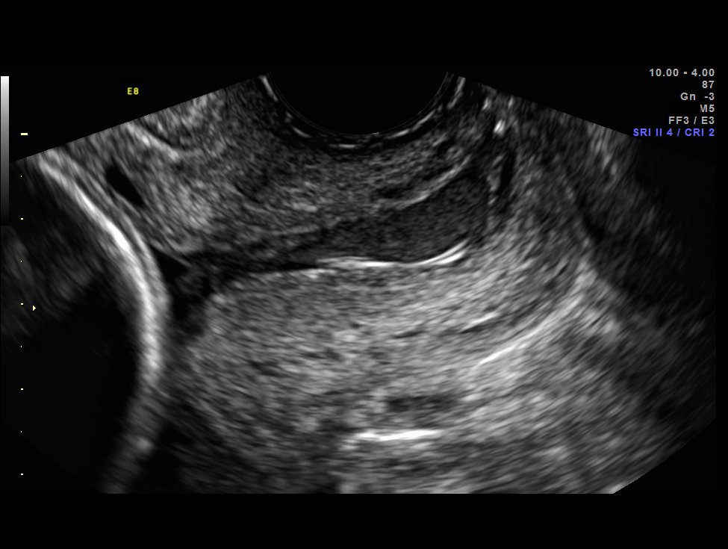
[im 13/17]
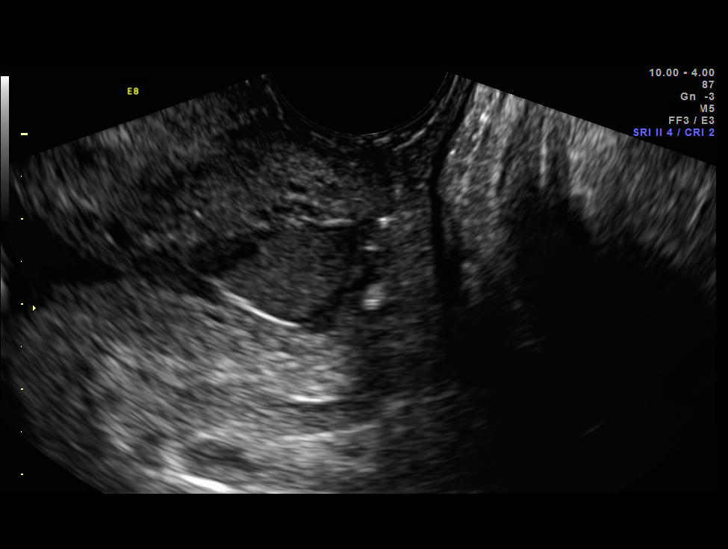
[im 14/17]
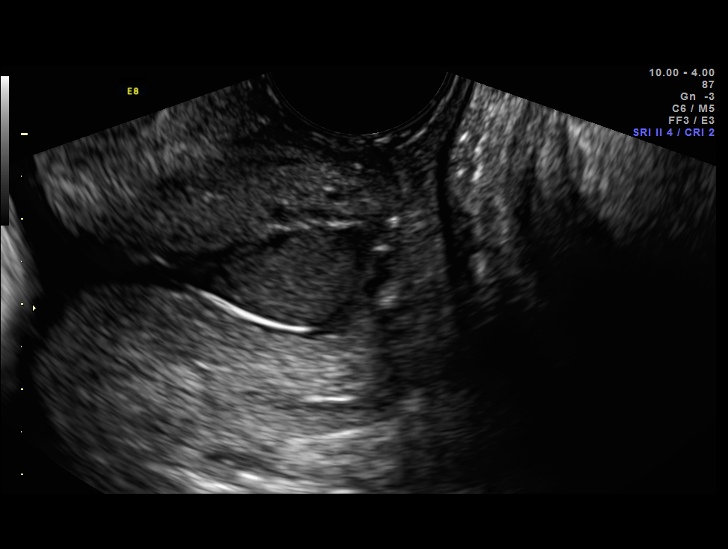
[im 16/17]
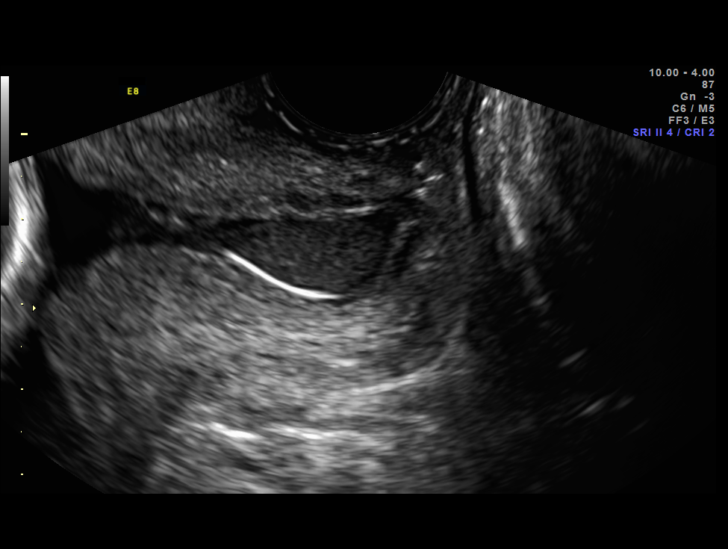
[im 17/17]
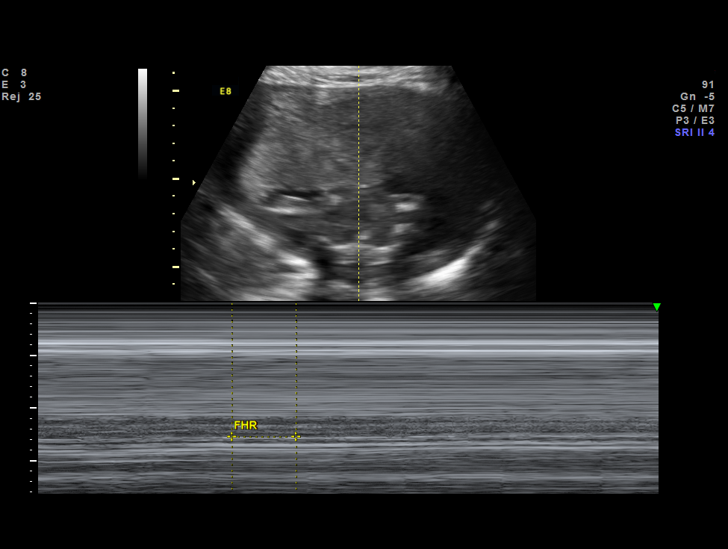

[13 of 17 positions shown; findings below may reference images not displayed]

OBSTETRICS REPORT
                      (Signed Final 01/07/2015 [DATE])

Service(s) Provided

 US MFM OB TRANSVAGINAL                                76817.2
Indications

 23 weeks gestation of pregnancy
 Maternal morbid obesity
 Low risk NIPS                                         R52
 Cervical insufficiency, 2nd; S/P cerclage
 Advanced maternal age multigravida 35+, second
 trimester
Fetal Evaluation

 Num Of Fetuses:    1
 Fetal Heart Rate:  157                          bpm
 Cardiac Activity:  Observed
 Presentation:      Cephalic
Gestational Age

 LMP:           16w 1d        Date:  09/16/14                 EDD:   06/23/15
 Best:          23w 0d     Det. By:  Early Ultrasound         EDD:   05/06/15
                                     (11/05/14)
Cervix Uterus Adnexa

 Cervical Length:    0.3      cm

 Cervix:       Measured transvaginally. Cerclage visualized.
Impression

 SIUP at 23+0 weeks
 Normal amniotic fluid volume
 EV views of cervix: V-shaped funneling almost to external os;
 membranes prolapsed into cervix; suture visualized; no
 change from [DATE] study

 #1 BMZ given today; plan #2 tomorrow
Recommendations

 CL in one week
 questions or concerns.
